# Patient Record
Sex: Female | Born: 1980 | Race: White | Hispanic: No | Marital: Single | Smoking: Current some day smoker
Health system: Southern US, Community
[De-identification: ages and names within clinical notes are randomized; demographics above are authoritative.]

## PROBLEM LIST (undated history)

## (undated) DIAGNOSIS — Z969 Presence of functional implant, unspecified: Secondary | ICD-10-CM

## (undated) DIAGNOSIS — Z98811 Dental restoration status: Secondary | ICD-10-CM

---

## 2005-05-10 ENCOUNTER — Emergency Department (HOSPITAL_COMMUNITY): Admission: EM | Admit: 2005-05-10 | Discharge: 2005-05-10 | Payer: Self-pay | Admitting: Family Medicine

## 2006-03-19 ENCOUNTER — Encounter (INDEPENDENT_AMBULATORY_CARE_PROVIDER_SITE_OTHER): Payer: Self-pay | Admitting: *Deleted

## 2006-03-19 ENCOUNTER — Ambulatory Visit (HOSPITAL_COMMUNITY): Admission: EM | Admit: 2006-03-19 | Discharge: 2006-03-20 | Payer: Self-pay | Admitting: Emergency Medicine

## 2006-03-19 HISTORY — PX: CHOLECYSTECTOMY: SHX55

## 2006-08-22 ENCOUNTER — Encounter (INDEPENDENT_AMBULATORY_CARE_PROVIDER_SITE_OTHER): Payer: Self-pay | Admitting: *Deleted

## 2006-08-22 ENCOUNTER — Ambulatory Visit: Payer: Self-pay | Admitting: Family Medicine

## 2006-08-22 ENCOUNTER — Other Ambulatory Visit: Admission: RE | Admit: 2006-08-22 | Discharge: 2006-08-22 | Payer: Self-pay | Admitting: Family Medicine

## 2006-08-22 ENCOUNTER — Encounter (INDEPENDENT_AMBULATORY_CARE_PROVIDER_SITE_OTHER): Payer: Self-pay | Admitting: Family Medicine

## 2006-08-22 LAB — CONVERTED CEMR LAB
Basophils Relative: 0.6 % (ref 0.0–1.0)
Cholesterol: 136 mg/dL (ref 0–200)
Eosinophils Relative: 0.9 % (ref 0.0–5.0)
HCT: 42.5 % (ref 36.0–46.0)
HCV Ab: NEGATIVE
Hemoglobin: 14.8 g/dL (ref 12.0–15.0)
Hep B S Ab: POSITIVE — AB
LDL Cholesterol: 74 mg/dL (ref 0–99)
MCHC: 34.7 g/dL (ref 30.0–36.0)
Monocytes Absolute: 0.7 10*3/uL (ref 0.2–0.7)
Neutrophils Relative %: 71.5 % (ref 43.0–77.0)
RBC: 4.83 M/uL (ref 3.87–5.11)
RDW: 11.9 % (ref 11.5–14.6)
VLDL: 21 mg/dL (ref 0–40)
WBC: 11.8 10*3/uL — ABNORMAL HIGH (ref 4.5–10.5)

## 2006-09-03 ENCOUNTER — Ambulatory Visit: Payer: Self-pay | Admitting: Family Medicine

## 2006-09-03 LAB — CONVERTED CEMR LAB
Basophils Absolute: 0 10*3/uL (ref 0.0–0.1)
Eosinophils Absolute: 0.2 10*3/uL (ref 0.0–0.6)
Eosinophils Relative: 2.3 % (ref 0.0–5.0)
HCT: 38.6 % (ref 36.0–46.0)
Hepatitis B Surface Ag: NEGATIVE
Neutrophils Relative %: 77.3 % — ABNORMAL HIGH (ref 43.0–77.0)
Platelets: 274 10*3/uL (ref 150–400)
RBC: 4.35 M/uL (ref 3.87–5.11)
RDW: 12 % (ref 11.5–14.6)
WBC: 10.2 10*3/uL (ref 4.5–10.5)

## 2006-09-05 ENCOUNTER — Encounter: Admission: RE | Admit: 2006-09-05 | Discharge: 2006-09-05 | Payer: Self-pay | Admitting: Family Medicine

## 2006-10-08 ENCOUNTER — Encounter (INDEPENDENT_AMBULATORY_CARE_PROVIDER_SITE_OTHER): Payer: Self-pay | Admitting: Family Medicine

## 2006-11-01 ENCOUNTER — Encounter (INDEPENDENT_AMBULATORY_CARE_PROVIDER_SITE_OTHER): Payer: Self-pay | Admitting: Family Medicine

## 2007-02-07 ENCOUNTER — Ambulatory Visit: Payer: Self-pay | Admitting: Family Medicine

## 2007-02-07 DIAGNOSIS — J069 Acute upper respiratory infection, unspecified: Secondary | ICD-10-CM | POA: Insufficient documentation

## 2007-02-07 DIAGNOSIS — J029 Acute pharyngitis, unspecified: Secondary | ICD-10-CM

## 2007-02-11 ENCOUNTER — Telehealth (INDEPENDENT_AMBULATORY_CARE_PROVIDER_SITE_OTHER): Payer: Self-pay | Admitting: *Deleted

## 2007-11-21 ENCOUNTER — Inpatient Hospital Stay (HOSPITAL_COMMUNITY): Admission: AD | Admit: 2007-11-21 | Discharge: 2007-11-21 | Payer: Self-pay | Admitting: *Deleted

## 2007-11-25 ENCOUNTER — Inpatient Hospital Stay (HOSPITAL_COMMUNITY): Admission: AD | Admit: 2007-11-25 | Discharge: 2007-11-28 | Payer: Self-pay | Admitting: *Deleted

## 2007-11-29 ENCOUNTER — Ambulatory Visit (HOSPITAL_COMMUNITY): Admission: RE | Admit: 2007-11-29 | Discharge: 2007-11-29 | Payer: Self-pay | Admitting: Obstetrics

## 2007-11-29 ENCOUNTER — Inpatient Hospital Stay (HOSPITAL_COMMUNITY): Admission: AD | Admit: 2007-11-29 | Discharge: 2007-11-29 | Payer: Self-pay | Admitting: Obstetrics & Gynecology

## 2007-12-01 ENCOUNTER — Inpatient Hospital Stay (HOSPITAL_COMMUNITY): Admission: AD | Admit: 2007-12-01 | Discharge: 2007-12-01 | Payer: Self-pay | Admitting: Obstetrics & Gynecology

## 2008-01-17 ENCOUNTER — Telehealth: Payer: Self-pay | Admitting: Family Medicine

## 2008-01-18 ENCOUNTER — Emergency Department (HOSPITAL_COMMUNITY): Admission: EM | Admit: 2008-01-18 | Discharge: 2008-01-18 | Payer: Self-pay | Admitting: Emergency Medicine

## 2009-01-13 HISTORY — PX: INDUCED ABORTION: SHX677

## 2010-09-30 NOTE — H&P (Signed)
NAMEJANIEL, DERHAMMER           ACCOUNT NO.:  [id number]   MEDICAL RECORD NO.:  [id number]          PATIENT TYPE:  EMS   LOCATION:  ED                           FACILITY:  [facility]   PHYSICIAN:  Clovis Pu. Cornett, M.D.DATE OF BIRTH:  Dec 20, 1980   DATE OF ADMISSION:  03/19/2006  DATE OF DISCHARGE:                                HISTORY & PHYSICAL   CHIEF COMPLAINT:  Right upper quadrant pain.   HISTORY OF PRESENT ILLNESS:  The patient is a 30 year old female with a one  day history of right upper quadrant pain with nausea and vomiting.  The pain  is severe in the right upper quadrant with radiation to her back.  Started  last night about 3 o'clock in the morning, has been constant ever since  getting worse as the day has gone on.  She had ultrasound done back in  February which showed cholelithiasis.  She was seen by her primary care  doctor today at Urgent Care and then sent here for me to see her.  She is  still complaining of severe right upper quadrant pain with nausea and  vomiting.  The pain is not getting any better.   PAST MEDICAL HISTORY:  None.   PAST SURGICAL HISTORY:  None.   FAMILY HISTORY:  Positive for gallstones in her mother.   SOCIAL HISTORY:  Positive for cigarette use, a pack a day, denies alcohol  use.   ALLERGIES:  None.   MEDICATIONS:  None.   REVIEW OF SYSTEMS:  As stated above, otherwise a 15 point review of systems  is negative.   PHYSICAL EXAM:  Temperature 98.4, pulse 97, blood pressure 140/97  GENERAL APPEARANCE:  Pleasant female in no apparent distress.  HEENT:  No scleral icterus, oropharynx is moist.  NECK:  Supple non tender full range of motion, no mass, trachea midline.  CHEST:  Clear to auscultation, chest wall motion normal.  CARDIOVASCULAR:  Regular rate and rhythm without rub, murmur or gallop.  EXTREMITIES:  Were warm with good perfusion.  ABDOMEN:  Tender right upper quadrant was some mild rebound, guarding and  positive Murphy's.   No mass or hernia.  EXTREMITIES:  Muscle tone normal,  range of motion normal.  NEUROLOGICAL:  Motor, sensory function grossly intact.   IMPRESSION:  Acute cholecystitis.   PLAN:  After reviewing her past ultrasound and given her symptomatology, she  at least has colic type symptoms and does have tenderness at this point  which would be consistent with acute cholecystitis.  I have offered her  laparoscopic cholecystectomy with cholangiogram.  I will try to do this  tonight if possible to get this as early as we can to ensure the best  possible outcome from the standpoint follow-up doing it laparoscopically.  I  explained the  procedure to her to include the complications of bleeding, infection,  potential common bile duct injury, injury to stomach, liver and colon.  She  voices her understanding and agrees to proceed.  Will go ahead and try to  get this done.      Thomas A. Cornett, M.D.  Electronically Signed  TAC/MEDQ  D:  03/19/2006  T:  03/20/2006  Job:  [id number]

## 2010-09-30 NOTE — Op Note (Signed)
Joanne Soto, ADCOX           ACCOUNT NO.:  [id number]   MEDICAL RECORD NO.:  [id number]          PATIENT TYPE:  INP   LOCATION:  0098                         FACILITY:  [facility]   PHYSICIAN:  Clovis Pu. Cornett, M.D.DATE OF BIRTH:  06-30-1980   DATE OF PROCEDURE:  03/19/2006  DATE OF DISCHARGE:                                 OPERATIVE REPORT   PREOPERATIVE DIAGNOSIS:  Acute cholecystitis.   POSTOPERATIVE DIAGNOSIS:  Acute cholecystitis.   PROCEDURE:  Laparoscopic cholecystectomy with intraoperative cholangiogram.   SURGEON:  Harriette Bouillon, MD   ANESTHESIA:  General endotracheal anesthesia with 20 mL of 0.25% Sensorcaine  local.   DRAINS:  None.   ESTIMATED BLOOD LOSS:  30 mL.   SPECIMEN:  Gallbladder with gallstones to pathology.   INDICATIONS FOR PROCEDURE:  The patient is 30 year old female who came in  tonight with acute cholecystitis.  She is brought to the operating room for  laparoscopic cholecystectomy.   DESCRIPTION OF PROCEDURE:  The patient brought to the operating suite,  placed supine.  Informed consent was obtained prior to coming to the  operating room.  After induction of general anesthesia the abdomen was  prepped and draped in sterile fashion.  A 1 cm supraumbilical incision was  made.  Dissection was carried down to her fascia.  Her fascia was opened in  the midline.  The abdominal cavity was entered with my finger bluntly.  A  pursestring suture of 0 Vicryl was placed in the fascial incision and a 12-  mm Hassan cannula was placed under direct vision.  Pneumoperitoneum was  created to 15 mmHg of CO2 and a laparoscope was placed.  Laparoscopy was  performed.  There was significant dilatation of the entire colon and small  bowel consistent with constipation.  The patient was placed in reverse  Trendelenburg and rolled to her left.  The 10-mm subxiphoid port was placed  under direct vision.  Two 5 mm ports placed the right midabdomen both under  direct  vision.  The gallbladder identified, grasped by its dome and  retracted toward the patient's right shoulder.  There was some early acute  inflammatory changes of edema noted to the gallbladder.  A second grasper  was used to grab the infundibulum.  This was pulled to her right lower  quadrant.  Cautery was used to score the peritoneum overlying the  infundibulum and push this away bluntly.  I was able to dissect out the  cystic duct circumferentially.  There were some small vessels overlying the  cystic duct going to the gallbladder which I controlled with a clip.  Once I  dissect out the cystic duct circumferentially this was the only tubular  structure entering the gallbladder.  I mobilized the gallbladder on both  medial and lateral peritoneal attachments to better mobilize the  infundibulum.  A clip was placed in the gallbladder side of the cystic duct.  Small incision was made.  Initially a Cook cholangiogram catheter was  introduced.  I could not get this to stay in the cystic duct due to a valve.  This was removed.  I used a  second Cook cholangiogram catheter and again  could not get it to feed down the cystic duct.  A Reddick catheter was used.  An Angiocath was then placed and the catheter was fed through it.  I was  able to get the catheter down the cystic duct a little bit and control it  with a clip.  It flush very hard and there was some extravasation but I was  able to dilate the cystic duct and flushed contrast through it to shoot an  intraoperative cholangiogram with fluoroscopy.  I was able to visualize the  cystic duct entering the common duct with free flow down the common duct  into the duodenum without any obvious signs of obstruction.  This free flow  of contrast up the common hepatic duct into the left and right hepatic  ducts.  This was very faint and very difficult to do due to that fact that I  could not pass the cholangiogram catheter far enough down to get a  clip  without compromising the lumen of it.  There felt to be a valve that I was  trying to get around and just had a very difficult time.  I saw enough of  the common duct bifurcation into a free flow of the duodenum but I did not  see any significant abnormality.  The catheter was removed, the cystic duct  was clipped with four clips and divided.  Cystic artery was dissected out.  It was controlled with clips and divided.  There is a posterior branch of  cystic artery.  This was controlled with clips and divided.  There smaller  vessels that I controlled with clips and divided the gallbladder and used  cautery to divide it.  The gallbladder was then dissected out of the  gallbladder fossa.  It was placed in the EndoCatch bag.  The gallbladder bed  was irrigated, suctioned and found to be hemostatic without signs of bile  leakage or bleeding.  The gallbladder was extracted through the umbilicus  and passed off the field.  The excess irrigation was suctioned out and the  abdominal cavity inspected for signs of bowel injury which I saw none.  At  this point, I removed all my ports, released the CO2 and closed the  umbilical fascia under direct vision with a pursestring suture of 0 Vicryl.  I then placed a second stitch to help secure this from the outside in the  fascia.  At this point, the CO2 was allowed to escape and all instruments  were withdrawn.  Skin was closed with 4-0 Monocryl.  Steri-Strips and dry  dressings were applied.  All final counts of sponge, needle and instruments  were found to be correct for this portion of the case.  The patient was  awoke, taken to recovery in satisfactory condition.      Thomas A. Cornett, M.D.  Electronically Signed     TAC/MEDQ  D:  03/19/2006  T:  03/20/2006  Job:  [id number]

## 2011-02-09 LAB — COMPREHENSIVE METABOLIC PANEL WITH GFR
ALT: 13
ALT: 16
AST: 23
AST: 24
Albumin: 2.2 — ABNORMAL LOW
Albumin: 2.5 — ABNORMAL LOW
Alkaline Phosphatase: 114
Alkaline Phosphatase: 129 — ABNORMAL HIGH
BUN: 6
BUN: 9
CO2: 20
CO2: 21
Calcium: 8.6
Calcium: 8.8
Chloride: 105
Chloride: 106
Creatinine, Ser: 0.57
Creatinine, Ser: 0.86
GFR calc non Af Amer: >60
GFR calc non Af Amer: >60
Glucose, Bld: 113 — ABNORMAL HIGH
Glucose, Bld: 128 — ABNORMAL HIGH
Potassium: 3.5
Potassium: 3.7
Sodium: 134 — ABNORMAL LOW
Sodium: 136
Total Bilirubin: 0.4
Total Bilirubin: 0.7
Total Protein: 5 — ABNORMAL LOW
Total Protein: 5.8 — ABNORMAL LOW

## 2011-02-09 LAB — DIFFERENTIAL
Band Neutrophils: 3
Basophils Relative: 0
Blasts: 0
Eosinophils Absolute: 0
Eosinophils Relative: 0
Lymphocytes Relative: 13
Lymphs Abs: 2.6
Monocytes Absolute: 0.8
Monocytes Relative: 4

## 2011-02-09 LAB — CBC
HCT: 31.1 — ABNORMAL LOW
HCT: 33.8 — ABNORMAL LOW
Hemoglobin: 11.8 — ABNORMAL LOW
MCHC: 34.7
MCHC: 35
MCV: 89.8
MCV: 90.7
Platelets: 225
Platelets: 243
RBC: 3.76 — ABNORMAL LOW
RDW: 13.5
RDW: 13.7
WBC: 12.4 — ABNORMAL HIGH

## 2011-02-09 LAB — LACTATE DEHYDROGENASE
LDH: 194
LDH: 203

## 2011-02-09 LAB — SYPHILIS: RPR W/REFLEX TO RPR TITER AND TREPONEMAL ANTIBODIES, TRADITIONAL SCREENING AND DIAGNOSIS ALGORITHM: RPR Ser Ql: NONREACTIVE

## 2011-02-09 LAB — URIC ACID: Uric Acid, Serum: 5.9

## 2011-02-10 LAB — COMPREHENSIVE METABOLIC PANEL
ALT: 24
Albumin: 2.2 — ABNORMAL LOW
Albumin: 2.6 — ABNORMAL LOW
Alkaline Phosphatase: 91
Alkaline Phosphatase: 96
BUN: 6
BUN: 9
CO2: 24
Calcium: 8.5
Chloride: 107
Creatinine, Ser: 0.65
GFR calc non Af Amer: >60
Glucose, Bld: 127 — ABNORMAL HIGH
Glucose, Bld: 79
Glucose, Bld: 95
Potassium: 4.1
Potassium: 4.2
Sodium: 139
Sodium: 141
Total Bilirubin: 0.2 — ABNORMAL LOW
Total Bilirubin: 0.4
Total Protein: 5.1 — ABNORMAL LOW
Total Protein: 5.4 — ABNORMAL LOW

## 2011-02-10 LAB — CBC
HCT: 30.9 — ABNORMAL LOW
Hemoglobin: 10.6 — ABNORMAL LOW
MCHC: 34.1
MCHC: 34.3
MCV: 91.2
RBC: 3.09 — ABNORMAL LOW
RBC: 3.35 — ABNORMAL LOW
RBC: 3.4 — ABNORMAL LOW
RDW: 13.8
RDW: 14.1
WBC: 11.1 — ABNORMAL HIGH

## 2011-02-10 LAB — LACTATE DEHYDROGENASE
LDH: 202
LDH: 241

## 2011-02-10 LAB — URIC ACID: Uric Acid, Serum: 5.7

## 2012-10-08 ENCOUNTER — Encounter: Payer: Self-pay | Admitting: *Deleted

## 2012-10-09 ENCOUNTER — Ambulatory Visit (INDEPENDENT_AMBULATORY_CARE_PROVIDER_SITE_OTHER): Payer: Commercial Managed Care - PPO | Admitting: Family Medicine

## 2012-10-09 ENCOUNTER — Encounter: Payer: Self-pay | Admitting: Family Medicine

## 2012-10-09 VITALS — BP 116/75 | HR 72 | Resp 16 | Ht 62.5 in | Wt 239.0 lb

## 2012-10-09 DIAGNOSIS — R41840 Attention and concentration deficit: Secondary | ICD-10-CM | POA: Insufficient documentation

## 2012-10-09 DIAGNOSIS — Z309 Encounter for contraceptive management, unspecified: Secondary | ICD-10-CM

## 2012-10-09 MED ORDER — METHYLPHENIDATE HCL ER (OSM) 54 MG PO TBCR: 54.0000 mg | EXTENDED_RELEASE_TABLET | ORAL | Status: DC

## 2012-10-09 MED ORDER — NORETHINDRONE ACET-ETHINYL EST 1-20 MG-MCG PO TABS: 1.0000 | ORAL_TABLET | Freq: Every day | ORAL | Status: DC

## 2012-10-09 NOTE — Assessment & Plan Note (Signed)
Refilled her Concerta 

## 2012-10-09 NOTE — Patient Instructions (Addendum)
Diet Following Bariatric Surgery The bariatric diet is designed to provide fluids and nourishment while promoting weight loss after bariatric surgery. The diet is divided into 3 stages. The rate of progression varies based on individual food tolerance. DIET FOLLOWING BARIATRIC SURGERY The diet following surgery is divided into 3 stages to allow a gradual adjustment. It is very important to the success of your surgery to:  Progress to each stage slowly.  Eat at set times.  Chew food well and stop eating when you are full.  Not drink liquids 30 minutes before and after meals. If you feel tightness or pressure in your chest, that means you are full. Wait 30 minutes before you try to eat again. STAGE 1 BARIATRIC DIET - ABOUT 2 WEEKS IN DURATION   The diet begins the day of surgery. It will last about 1 to 2 weeks after surgery. Your surgeon may have individual guidelines for you about specific foods or the progression of your diet. Follow your surgeon's guidelines.  If clear liquids are well-tolerated without vomiting, your caregiver will add a 4 oz to 6 oz high protein, low-calorie liquid supplement. You could add this to your meal plan 3 times daily. You will need at least 60 g to 80 g of protein daily or as determined by your Registered Dietitian.  Guidelines for choosing a protein supplement include:  At least 15 g of protein per 8 oz serving.  Less than 20 g total carbohydrate per 8 oz serving.  Less than 5 g fat per 8 oz serving.  Avoid carbonated beverages, caffeine, alcohol, and concentrated sweets such as sugar, cakes, and cookies.  Right after surgery, you may only be able to eat 3 to 4 tsp per meal. Your maximum volume should not exceed  to  cup total. Do not eat or drink more than 1 oz or 2 tbs every 15 minutes.  Take a chewable multivitamin and mineral supplement.  Drink at least 48 oz of fluid daily, which includes your protein supplement. Food and beverages from the  list below are allowed at set times (for example at 8 AM, 12 noon, or 5 PM):  Decaffeinated coffee or tea.  100% fruit juice.  Diet or sugar-free drinks.  Broth.  Blenderized soup.  Skim milk or lactose-free milk.  Sugar-free gelatin dessert or frozen ice pops.  Mashed potatoes.  Yogurt (artificially sweetened).  Sugar-free pudding.  Blended low-fat cottage cheese.  Unsweetened applesauce, grits, or hot wheat cereal. Four to six ounces of a liquid protein supplement from the list below is recommended for snacks at 10 AM, 2 PM, and 8 PM.  STAGE 2 BARIATRIC DIET (SOFT DIET) - ABOUT 4 WEEKS IN DURATION  About 2 weeks after surgery, your caregiver will progress your diet to this stage. Foods may need to be blended to the consistency of applesauce. Choose low-fat foods (less than 5 g of fat per serving) and avoid concentrated sweets and sugar (less than 10 g of sugar per serving). Meals should not exceed  to  cup total. This stage will last about 4 weeks. It is recommended that you meet with your dietitian at this stage to begin preparation for the last stage. This stage consists of 3 meals a day with a liquid protein supplement between meals twice daily. Do not drink liquids with foods. You must wait 30 minutes for the stomach pouch to empty before drinking. Chew food well. The food must be almost liquified before swallowing. Soft foods from the   list below can now be slowly added to your diet:  Soft fruit (soft canned fruit in light syrup or natural juice, banana, melon, peaches, pears, or strawberries).  Cooked vegetables.  Toast or crackers (becomes soft after chewing 20 times).  Hot wheat cereal.  Fish.  Eggs (scrambled, soft-boiled). STAGE 3 BARIATRIC DIET (REGULAR DIET) - ABOUT 6 to 8 WEEKS AFTER SURGERY About 6 to 8 weeks after surgery, you will be advanced to food that is regular in texture. This diet should include all food groups. The diet will continue to promote  weight loss. Meals should not exceed  to 1 cup total. Your dietitian will be available to assist you in meal planning and additional behavioral strategies to make this final stage a long-term success. Slowly add foods of regular consistency and remember:  Eat only at your chosen meal times.  Minimize drinking with meals. You should drink 30 minutes before eating. Do not start drinking again for about 2 hours after eating.  Chew food well. Take small bites.  Think about the portion size of a healthy frozen meal. You will be able to eat most of this.  Make sure your meal is balanced with starch, protein, fruits, and vegetables.  When you feel full, stop eating. Document Released: 11/05/2002 Document Revised: 07/24/2011 Document Reviewed: 07/29/2010 [facility] Patient Information 2014 [place], [place]. Diet Following Bariatric Surgery The bariatric diet is designed to provide fluids and nourishment while promoting weight loss after bariatric surgery. The diet is divided into 3 stages. The rate of progression varies based on individual food tolerance. DIET FOLLOWING BARIATRIC SURGERY The diet following surgery is divided into 3 stages to allow a gradual adjustment. It is very important to the success of your surgery to:  Progress to each stage slowly.  Eat at set times.  Chew food well and stop eating when you are full.  Not drink liquids 30 minutes before and after meals. If you feel tightness or pressure in your chest, that means you are full. Wait 30 minutes before you try to eat again. STAGE 1 BARIATRIC DIET - ABOUT 2 WEEKS IN DURATION   The diet begins the day of surgery. It will last about 1 to 2 weeks after surgery. Your surgeon may have individual guidelines for you about specific foods or the progression of your diet. Follow your surgeon's guidelines.  If clear liquids are well-tolerated without vomiting, your caregiver will add a 4 oz to 6 oz high protein, low-calorie liquid  supplement. You could add this to your meal plan 3 times daily. You will need at least 60 g to 80 g of protein daily or as determined by your Registered Dietitian.  Guidelines for choosing a protein supplement include:  At least 15 g of protein per 8 oz serving.  Less than 20 g total carbohydrate per 8 oz serving.  Less than 5 g fat per 8 oz serving.  Avoid carbonated beverages, caffeine, alcohol, and concentrated sweets such as sugar, cakes, and cookies.  Right after surgery, you may only be able to eat 3 to 4 tsp per meal. Your maximum volume should not exceed  to  cup total. Do not eat or drink more than 1 oz or 2 tbs every 15 minutes.  Take a chewable multivitamin and mineral supplement.  Drink at least 48 oz of fluid daily, which includes your protein supplement. Food and beverages from the list below are allowed at set times (for example at 8 AM, 12 noon, or 5  PM):  Decaffeinated coffee or tea.  100% fruit juice.  Diet or sugar-free drinks.  Broth.  Blenderized soup.  Skim milk or lactose-free milk.  Sugar-free gelatin dessert or frozen ice pops.  Mashed potatoes.  Yogurt (artificially sweetened).  Sugar-free pudding.  Blended low-fat cottage cheese.  Unsweetened applesauce, grits, or hot wheat cereal. Four to six ounces of a liquid protein supplement from the list below is recommended for snacks at 10 AM, 2 PM, and 8 PM.  STAGE 2 BARIATRIC DIET (SOFT DIET) - ABOUT 4 WEEKS IN DURATION  About 2 weeks after surgery, your caregiver will progress your diet to this stage. Foods may need to be blended to the consistency of applesauce. Choose low-fat foods (less than 5 g of fat per serving) and avoid concentrated sweets and sugar (less than 10 g of sugar per serving). Meals should not exceed  to  cup total. This stage will last about 4 weeks. It is recommended that you meet with your dietitian at this stage to begin preparation for the last stage. This stage consists  of 3 meals a day with a liquid protein supplement between meals twice daily. Do not drink liquids with foods. You must wait 30 minutes for the stomach pouch to empty before drinking. Chew food well. The food must be almost liquified before swallowing. Soft foods from the list below can now be slowly added to your diet:  Soft fruit (soft canned fruit in light syrup or natural juice, banana, melon, peaches, pears, or strawberries).  Cooked vegetables.  Toast or crackers (becomes soft after chewing 20 times).  Hot wheat cereal.  Fish.  Eggs (scrambled, soft-boiled). STAGE 3 BARIATRIC DIET (REGULAR DIET) - ABOUT 6 to 8 WEEKS AFTER SURGERY About 6 to 8 weeks after surgery, you will be advanced to food that is regular in texture. This diet should include all food groups. The diet will continue to promote weight loss. Meals should not exceed  to 1 cup total. Your dietitian will be available to assist you in meal planning and additional behavioral strategies to make this final stage a long-term success. Slowly add foods of regular consistency and remember:  Eat only at your chosen meal times.  Minimize drinking with meals. You should drink 30 minutes before eating. Do not start drinking again for about 2 hours after eating.  Chew food well. Take small bites.  Think about the portion size of a healthy frozen meal. You will be able to eat most of this.  Make sure your meal is balanced with starch, protein, fruits, and vegetables.  When you feel full, stop eating. Document Released: 11/05/2002 Document Revised: 07/24/2011 Document Reviewed: 07/29/2010 [facility] Patient Information 2014 [place], [place].

## 2012-10-09 NOTE — Assessment & Plan Note (Signed)
Refilled her Loestrin.

## 2012-10-09 NOTE — Progress Notes (Signed)
  Subjective:    Patient ID: Joanne Soto, female    DOB: 1980/09/22, 32 y.o.   MRN: [id number]  HPI  Joanne Soto is here today to discuss a few issues:    1)  ADD:  She needs to get a refill on her Concerta.    2)  Oral Contraceptives:  She has done well on her birth control and needs a refill on it.   3)  Weight:  She has been working hard on her diet and exercise.  She is working out with a Psychologist, educational.     Review of Systems  Constitutional: Negative for activity change, fatigue and unexpected weight change (She is working hard on her diet and exercise.  ).       She has been dieting and working out with a trainer for the past 2 months.    HENT: Negative.   Eyes: Negative.   Respiratory: Negative for shortness of breath.   Cardiovascular: Negative for chest pain, palpitations and leg swelling.  Gastrointestinal: Negative for diarrhea and constipation.  Endocrine: Negative.   Genitourinary: Negative for difficulty urinating.  Musculoskeletal: Negative.   Skin: Negative.   Neurological: Negative.   Hematological: Negative for adenopathy. Does not bruise/bleed easily.  Psychiatric/Behavioral: Positive for decreased concentration. Negative for sleep disturbance and dysphoric mood. The patient is not nervous/anxious.     Past Medical History  Diagnosis Date  . High risk HPV infection   . HSV-2 (herpes simplex virus 2) infection   . Ovarian cyst    Family History  Problem Relation Age of Onset  . Hypertension Father   . Heart disease Paternal Grandfather   . CVA Paternal Grandfather   . Diabetes Paternal Grandfather   . Hypertension Paternal Grandfather    History   Social History Narrative   Marital Status: Single Recruitment consultant)    Children:  Daughter Joanne Soto)    Pets:  None    Living Situation: Lives with her daughter and grandmother Joanne Soto).   Occupation: CNA Editor, commissioning ED)     Education:  Nursing Student ([employer])       Tobacco Use/Exposure:  She has smoked  up to 1/2 ppd.  She has smoked off and on since the age of 79. She quit during her pregnancy and did not smoke for 4 years.  She started back in the spring of 2012.  She is currently smoking 1 pack per week.  She would like to quit.     Alcohol Use:  Occasional   Drug Use:  None   Diet:  Regular    Exercise:  Active Job    Hobbies:  Reading                Objective:   Physical Exam  Constitutional: She appears well-nourished. No distress.  HENT:  Head: Normocephalic.  Eyes: No scleral icterus.  Neck: No thyromegaly present.  Cardiovascular: Normal rate, regular rhythm and normal heart sounds.   Pulmonary/Chest: Effort normal and breath sounds normal.  Abdominal: There is no tenderness.  Musculoskeletal: She exhibits no edema and no tenderness.  Neurological: She is alert.  Skin: Skin is warm and dry.  Psychiatric: She has a normal mood and affect. Her behavior is normal. Judgment and thought content normal.        Assessment & Plan:

## 2012-10-10 ENCOUNTER — Telehealth: Payer: Self-pay | Admitting: *Deleted

## 2012-10-10 NOTE — Telephone Encounter (Signed)
Opened in error

## 2012-10-11 ENCOUNTER — Telehealth: Payer: Self-pay | Admitting: Family Medicine

## 2012-10-15 ENCOUNTER — Telehealth: Payer: Self-pay

## 2012-10-15 NOTE — Telephone Encounter (Signed)
Called patient to find out preference on birth control medication. She would prefer no Fe.

## 2012-11-02 ENCOUNTER — Encounter: Payer: Self-pay | Admitting: Family Medicine

## 2012-11-02 NOTE — Assessment & Plan Note (Signed)
She will continue to work on diet and exercise. 

## 2012-11-04 NOTE — Telephone Encounter (Signed)
Done

## 2013-01-09 ENCOUNTER — Ambulatory Visit (INDEPENDENT_AMBULATORY_CARE_PROVIDER_SITE_OTHER): Payer: Commercial Managed Care - PPO | Admitting: *Deleted

## 2013-01-09 ENCOUNTER — Ambulatory Visit: Payer: Commercial Managed Care - PPO | Admitting: Family Medicine

## 2013-01-09 DIAGNOSIS — Z23 Encounter for immunization: Secondary | ICD-10-CM

## 2013-02-18 ENCOUNTER — Encounter: Payer: Self-pay | Admitting: Family Medicine

## 2013-02-18 ENCOUNTER — Other Ambulatory Visit: Payer: Self-pay | Admitting: Family Medicine

## 2013-02-18 ENCOUNTER — Encounter: Payer: Self-pay | Admitting: *Deleted

## 2013-02-18 ENCOUNTER — Ambulatory Visit (INDEPENDENT_AMBULATORY_CARE_PROVIDER_SITE_OTHER): Payer: Commercial Managed Care - PPO | Admitting: Family Medicine

## 2013-02-18 VITALS — BP 122/85 | HR 82 | Wt 268.0 lb

## 2013-02-18 DIAGNOSIS — Z2089 Contact with and (suspected) exposure to other communicable diseases: Secondary | ICD-10-CM

## 2013-02-18 DIAGNOSIS — A5901 Trichomonal vulvovaginitis: Secondary | ICD-10-CM

## 2013-02-18 DIAGNOSIS — Z202 Contact with and (suspected) exposure to infections with a predominantly sexual mode of transmission: Secondary | ICD-10-CM

## 2013-02-18 DIAGNOSIS — N760 Acute vaginitis: Secondary | ICD-10-CM

## 2013-02-18 MED ORDER — CLINDAMYCIN PHOSPHATE 2 % VA CREA: 1.0000 | TOPICAL_CREAM | Freq: Every day | VAGINAL | Status: DC

## 2013-02-18 MED ORDER — METRONIDAZOLE 500 MG PO TABS
ORAL_TABLET | ORAL | Status: DC
Start: 2013-02-18 — End: 2013-04-04

## 2013-02-18 NOTE — Progress Notes (Signed)
Subjective:    Patient ID: Joanne Soto, female    DOB: 1980/10/21, 32 y.o.   MRN: [id number]  Joanne Soto is here today to have a vaginal exam.  She has been struggling with a vaginal discharge and irritation for the past two weeks   Vaginal Discharge The patient's primary symptoms include genital itching, a genital odor and a vaginal discharge. The patient's pertinent negatives include no genital lesions or pelvic pain. This is a new problem. Episode onset: 2 weeks ago. The problem has been unchanged. The patient is experiencing no pain. She is not pregnant. Pertinent negatives include no dysuria, fever or painful intercourse. The vaginal discharge was malodorous, thin, copious, green and yellow. There has been no bleeding. Nothing aggravates the symptoms. She has tried antifungals for the symptoms. The treatment provided no relief. She is sexually active. No, her partner does not have an STD. She uses oral contraceptives for contraception. Her menstrual history has been regular. Her past medical history is significant for herpes simplex. STD: HPV (High Risk)     Review of Systems  Constitutional: Negative.  Negative for fever.  HENT: Negative.   Eyes: Negative.   Respiratory: Negative.   Cardiovascular: Negative.   Gastrointestinal: Negative.   Endocrine: Negative.   Genitourinary: Positive for vaginal discharge. Negative for dysuria and pelvic pain.  Musculoskeletal: Negative.   Allergic/Immunologic: Negative.   Neurological: Negative.   Hematological: Negative.   Psychiatric/Behavioral: Negative.      Past Medical History  Diagnosis Date  . High risk HPV infection   . HSV-2 (herpes simplex virus 2) infection   . Ovarian cyst      Family History  Problem Relation Age of Onset  . Hypertension Father   . Heart disease Paternal Grandfather   . CVA Paternal Grandfather   . Diabetes Paternal Grandfather   . Hypertension Paternal Grandfather      History   Social  History Narrative   Marital Status: Single Recruitment consultant)    Children:  Daughter Jarold Song)    Pets:  None    Living Situation: Lives with her daughter and grandmother Joanne Soto).   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  She has smoked off and on since the age of 31. She quit during her pregnancy and did not smoke for 4 years.  She started back in the spring of 2012.  She is currently smoking 1 pack per week.  She would like to quit.     Alcohol Use:  Occasional   Drug Use:  None   Diet:  Regular    Exercise:  Active Job    Hobbies:  Reading                 Objective:   Physical Exam  Vitals reviewed. Constitutional: She appears well-developed and well-nourished.  Cardiovascular: Normal rate and regular rhythm.   Pulmonary/Chest: Effort normal and breath sounds normal.  Abdominal: Soft. Bowel sounds are normal.  Genitourinary: Vaginal discharge found.  Skin: Skin is warm and dry.  Psychiatric: She has a normal mood and affect.      Assessment & Plan:   Neriah was seen today for vaginal discharge.  Diagnoses and associated orders for this visit:  Trichomoniasis of vagina - metroNIDAZOLE (FLAGYL) 500 MG tablet; Take 4 tabs po x 1 day - clindamycin (CLEOCIN) 2 % vaginal cream;  Place 1 Applicatorful vaginally at bedtime.  Exposure to sexually transmitted disease (STD) - HIV Antibody ( Reflex) - Hepatitis C Antibody - GC/chlamydia probe amp, genital

## 2013-02-18 NOTE — Patient Instructions (Signed)
1)  Vaginal Discharge- Take the 4 Flagyl then insert the clindamycin cream night for 7 days.   2)  STD Exposure - We've tested you for GC/CH.  I would recommend getting testing for HIV/Hep C.       Trichomoniasis Trichomoniasis is an infection, caused by the Trichomonas organism, that affects both women and men. In women, the outer female genitalia and the vagina are affected. In men, the penis is mainly affected, but the prostate and other reproductive organs can also be involved. Trichomoniasis is a sexually transmitted disease (STD) and is most often passed to another person through sexual contact. The majority of people who get trichomoniasis do so from a sexual encounter and are also at risk for other STDs. CAUSES   Sexual intercourse with an infected partner.  It can be present in swimming pools or hot tubs. SYMPTOMS   Abnormal gray-green frothy vaginal discharge in women.  Vaginal itching and irritation in women.  Itching and irritation of the area outside the vagina in women.  Penile discharge with or without pain in males.  Inflammation of the urethra (urethritis), causing painful urination.  Bleeding after sexual intercourse. RELATED COMPLICATIONS  Pelvic inflammatory disease.  Infection of the uterus (endometritis).  Infertility.  Tubal (ectopic) pregnancy.  It can be associated with other STDs, including gonorrhea and chlamydia, hepatitis B, and HIV. COMPLICATIONS DURING PREGNANCY  Early (premature) delivery.  Premature rupture of the membranes (PROM).  Low birth weight. DIAGNOSIS   Visualization of Trichomonas under the microscope from the vagina discharge.  Ph of the vagina greater than 4.5, tested with a test tape.  Trich Rapid Test.  Culture of the organism, but this is not usually needed.  It may be found on a Pap test.  Having a "strawberry cervix,"which means the cervix looks very red like a strawberry. TREATMENT   You may be given  medication to fight the infection. Inform your caregiver if you could be or are pregnant. Some medications used to treat the infection should not be taken during pregnancy.  Over-the-counter medications or creams to decrease itching or irritation may be recommended.  Your sexual partner will need to be treated if infected. HOME CARE INSTRUCTIONS   Take all medication prescribed by your caregiver.  Take over-the-counter medication for itching or irritation as directed by your caregiver.  Do not have sexual intercourse while you have the infection.  Do not douche or wear tampons.  Discuss your infection with your partner, as your partner may have acquired the infection from you. Or, your partner may have been the person who transmitted the infection to you.  Have your sex partner examined and treated if necessary.  Practice safe, informed, and protected sex.  See your caregiver for other STD testing. SEEK MEDICAL CARE IF:   You still have symptoms after you finish the medication.  You have an oral temperature above 102 F (38.9 C).  You develop belly (abdominal) pain.  You have pain when you urinate.  You have bleeding after sexual intercourse.  You develop a rash.  The medication makes you sick or makes you throw up (vomit). Document Released: 10/25/2000 Document Revised: 07/24/2011 Document Reviewed: 11/20/2008 [facility] Patient Information 2014 [place], [place].

## 2013-02-19 ENCOUNTER — Telehealth: Payer: Self-pay | Admitting: *Deleted

## 2013-02-19 LAB — GC/CHLAMYDIA PROBE AMP
CT Probe RNA: NEGATIVE
GC Probe RNA: NEGATIVE

## 2013-02-19 NOTE — Telephone Encounter (Signed)
Joanne Soto is aware that her labs were negative.-eh

## 2013-03-21 ENCOUNTER — Other Ambulatory Visit: Payer: Self-pay | Admitting: Family Medicine

## 2013-04-04 ENCOUNTER — Encounter: Payer: Self-pay | Admitting: Family Medicine

## 2013-04-04 ENCOUNTER — Ambulatory Visit (INDEPENDENT_AMBULATORY_CARE_PROVIDER_SITE_OTHER): Payer: Commercial Managed Care - PPO | Admitting: Family Medicine

## 2013-04-04 VITALS — BP 107/66 | HR 69 | Resp 16 | Wt 273.0 lb

## 2013-04-04 DIAGNOSIS — B9689 Other specified bacterial agents as the cause of diseases classified elsewhere: Secondary | ICD-10-CM

## 2013-04-04 DIAGNOSIS — N760 Acute vaginitis: Secondary | ICD-10-CM

## 2013-04-04 DIAGNOSIS — F329 Major depressive disorder, single episode, unspecified: Secondary | ICD-10-CM

## 2013-04-04 DIAGNOSIS — A499 Bacterial infection, unspecified: Secondary | ICD-10-CM

## 2013-04-04 DIAGNOSIS — M722 Plantar fascial fibromatosis: Secondary | ICD-10-CM

## 2013-04-04 DIAGNOSIS — F3289 Other specified depressive episodes: Secondary | ICD-10-CM

## 2013-04-04 DIAGNOSIS — R41840 Attention and concentration deficit: Secondary | ICD-10-CM

## 2013-04-04 MED ORDER — FLUOXETINE HCL 40 MG PO CAPS: 40.0000 mg | ORAL_CAPSULE | Freq: Every day | ORAL | Status: DC

## 2013-04-04 MED ORDER — DICLOFENAC 35 MG PO CAPS: 1.0000 | ORAL_CAPSULE | Freq: Two times a day (BID) | ORAL | Status: DC

## 2013-04-04 MED ORDER — CLINDAMYCIN PHOSPHATE 2 % VA CREA: 1.0000 | TOPICAL_CREAM | Freq: Every day | VAGINAL | Status: DC

## 2013-04-04 MED ORDER — METHYLPHENIDATE HCL ER (OSM) 54 MG PO TBCR: 54.0000 mg | EXTENDED_RELEASE_TABLET | ORAL | Status: DC

## 2013-04-04 MED ORDER — NAPROXEN-ESOMEPRAZOLE 500-20 MG PO TBEC: 1.0000 | DELAYED_RELEASE_TABLET | Freq: Two times a day (BID) | ORAL | Status: DC

## 2013-04-04 MED ORDER — METRONIDAZOLE 0.75 % VA GEL: 1.0000 | Freq: Two times a day (BID) | VAGINAL | Status: DC

## 2013-04-04 NOTE — Patient Instructions (Signed)
1)  BV -  Metrogel vs Cleocin; Boric Acid in Douche; RePhresh Products   2)  Plantar Fasciitis /Bone Spur - Anti-inflammatory + Orthotics (Podiatrist)    Plantar Fasciitis (Heel Spur Syndrome) with Rehab The plantar fascia is a fibrous, ligament-like, soft-tissue structure that spans the bottom of the foot. Plantar fasciitis is a condition that causes pain in the foot due to inflammation of the tissue. SYMPTOMS   Pain and tenderness on the underneath side of the foot.  Pain that worsens with standing or walking. CAUSES  Plantar fasciitis is caused by irritation and injury to the plantar fascia on the underneath side of the foot. Common mechanisms of injury include:  Direct trauma to bottom of the foot.  Damage to a small nerve that runs under the foot where the main fascia attaches to the heel bone.  Stress placed on the plantar fascia due to bone spurs. RISK INCREASES WITH:   Activities that place stress on the plantar fascia (running, jumping, pivoting, or cutting).  Poor strength and flexibility.  Improperly fitted shoes.  Tight calf muscles.  Flat feet.  Failure to warm-up properly before activity.  Obesity. PREVENTION  Warm up and stretch properly before activity.  Allow for adequate recovery between workouts.  Maintain physical fitness:  Strength, flexibility, and endurance.  Cardiovascular fitness.  Maintain a health body weight.  Avoid stress on the plantar fascia.  Wear properly fitted shoes, including arch supports for individuals who have flat feet. PROGNOSIS  If treated properly, then the symptoms of plantar fasciitis usually resolve without surgery. However, occasionally surgery is necessary. RELATED COMPLICATIONS   Recurrent symptoms that may result in a chronic condition.  Problems of the lower back that are caused by compensating for the injury, such as limping.  Pain or weakness of the foot during push-off following surgery.  Chronic  inflammation, scarring, and partial or complete fascia tear, occurring more often from repeated injections. TREATMENT  Treatment initially involves the use of ice and medication to help reduce pain and inflammation. The use of strengthening and stretching exercises may help reduce pain with activity, especially stretches of the Achilles tendon. These exercises may be performed at home or with a therapist. Your caregiver may recommend that you use heel cups of arch supports to help reduce stress on the plantar fascia. Occasionally, corticosteroid injections are given to reduce inflammation. If symptoms persist for greater than 6 months despite non-surgical (conservative), then surgery may be recommended.  MEDICATION   If pain medication is necessary, then nonsteroidal anti-inflammatory medications, such as aspirin and ibuprofen, or other minor pain relievers, such as acetaminophen, are often recommended.  Do not take pain medication within 7 days before surgery.  Prescription pain relievers may be given if deemed necessary by your caregiver. Use only as directed and only as much as you need.  Corticosteroid injections may be given by your caregiver. These injections should be reserved for the most serious cases, because they may only be given a certain number of times. HEAT AND COLD  Cold treatment (icing) relieves pain and reduces inflammation. Cold treatment should be applied for 10 to 15 minutes every 2 to 3 hours for inflammation and pain and immediately after any activity that aggravates your symptoms. Use ice packs or massage the area with a piece of ice (ice massage).  Heat treatment may be used prior to performing the stretching and strengthening activities prescribed by your caregiver, physical therapist, or athletic trainer. Use a heat pack or soak  the injury in warm water. SEEK IMMEDIATE MEDICAL CARE IF:  Treatment seems to offer no benefit, or the condition worsens.  Any medications  produce adverse side effects. EXERCISES RANGE OF MOTION (ROM) AND STRETCHING EXERCISES - Plantar Fasciitis (Heel Spur Syndrome) These exercises may help you when beginning to rehabilitate your injury. Your symptoms may resolve with or without further involvement from your physician, physical therapist or athletic trainer. While completing these exercises, remember:   Restoring tissue flexibility helps normal motion to return to the joints. This allows healthier, less painful movement and activity.  An effective stretch should be held for at least 30 seconds.  A stretch should never be painful. You should only feel a gentle lengthening or release in the stretched tissue. RANGE OF MOTION - Toe Extension, Flexion  Sit with your right / left leg crossed over your opposite knee.  Grasp your toes and gently pull them back toward the top of your foot. You should feel a stretch on the bottom of your toes and/or foot.  Hold this stretch for __________ seconds.  Now, gently pull your toes toward the bottom of your foot. You should feel a stretch on the top of your toes and or foot.  Hold this stretch for __________ seconds. Repeat __________ times. Complete this stretch __________ times per day.  RANGE OF MOTION - Ankle Dorsiflexion, Active Assisted  Remove shoes and sit on a chair that is preferably not on a carpeted surface.  Place right / left foot under knee. Extend your opposite leg for support.  Keeping your heel down, slide your right / left foot back toward the chair until you feel a stretch at your ankle or calf. If you do not feel a stretch, slide your bottom forward to the edge of the chair, while still keeping your heel down.  Hold this stretch for __________ seconds. Repeat __________ times. Complete this stretch __________ times per day.  STRETCH  Gastroc, Standing  Place hands on wall.  Extend right / left leg, keeping the front knee somewhat bent.  Slightly point your toes  inward on your back foot.  Keeping your right / left heel on the floor and your knee straight, shift your weight toward the wall, not allowing your back to arch.  You should feel a gentle stretch in the right / left calf. Hold this position for __________ seconds. Repeat __________ times. Complete this stretch __________ times per day. STRETCH  Soleus, Standing  Place hands on wall.  Extend right / left leg, keeping the other knee somewhat bent.  Slightly point your toes inward on your back foot.  Keep your right / left heel on the floor, bend your back knee, and slightly shift your weight over the back leg so that you feel a gentle stretch deep in your back calf.  Hold this position for __________ seconds. Repeat __________ times. Complete this stretch __________ times per day. STRETCH  Gastrocsoleus, Standing  Note: This exercise can place a lot of stress on your foot and ankle. Please complete this exercise only if specifically instructed by your caregiver.   Place the ball of your right / left foot on a step, keeping your other foot firmly on the same step.  Hold on to the wall or a rail for balance.  Slowly lift your other foot, allowing your body weight to press your heel down over the edge of the step.  You should feel a stretch in your right / left calf.  Hold this position for __________ seconds.  Repeat this exercise with a slight bend in your right / left knee. Repeat __________ times. Complete this stretch __________ times per day.  STRENGTHENING EXERCISES - Plantar Fasciitis (Heel Spur Syndrome)  These exercises may help you when beginning to rehabilitate your injury. They may resolve your symptoms with or without further involvement from your physician, physical therapist or athletic trainer. While completing these exercises, remember:   Muscles can gain both the endurance and the strength needed for everyday activities through controlled exercises.  Complete these  exercises as instructed by your physician, physical therapist or athletic trainer. Progress the resistance and repetitions only as guided. STRENGTH - Towel Curls  Sit in a chair positioned on a non-carpeted surface.  Place your foot on a towel, keeping your heel on the floor.  Pull the towel toward your heel by only curling your toes. Keep your heel on the floor.  If instructed by your physician, physical therapist or athletic trainer, add ____________________ at the end of the towel. Repeat __________ times. Complete this exercise __________ times per day. STRENGTH - Ankle Inversion  Secure one end of a rubber exercise band/tubing to a fixed object (table, pole). Loop the other end around your foot just before your toes.  Place your fists between your knees. This will focus your strengthening at your ankle.  Slowly, pull your big toe up and in, making sure the band/tubing is positioned to resist the entire motion.  Hold this position for __________ seconds.  Have your muscles resist the band/tubing as it slowly pulls your foot back to the starting position. Repeat __________ times. Complete this exercises __________ times per day.  Document Released: 05/01/2005 Document Revised: 07/24/2011 Document Reviewed: 08/13/2008 [facility] Patient Information 2014 [place], [place].

## 2013-04-04 NOTE — Progress Notes (Signed)
Subjective:    Patient ID: Joanne Soto, female    DOB: 09/23/1980, 32 y.o.   MRN: [id number]  HPI  [name] is here today for medication refills and to discuss the conditions listed below:   1)  Foot Pain:  She has struggled with pain in her feet for several years .  She has tried many OTC devices which have not helped her very much.  She feels that this pain is worse when she is standing.   2)  Mood:  She feels that her mood is stable with her Prozac (40 mg).    3)  ADD:  She needs a refill on her ADD medication (Concerta 54 mg).  She says that the medication continues to help her concentrate at school/work.  She feels that this dosage is appropriate and would like to continue on it.    4)  Trichomonas:  She has completed her treatment with Flagyl and clindamycin cream.  She would like a GYN exam to confirm that this condition has cleared.    Review of Systems  Constitutional: Negative.   HENT: Negative.   Eyes: Negative.   Respiratory: Negative.   Cardiovascular: Negative.   Gastrointestinal: Negative.   Endocrine: Negative.   Genitourinary: Negative.   Musculoskeletal:       Bilateral feet pain.    Skin: Negative.   Allergic/Immunologic: Negative.   Neurological: Negative.   Hematological: Negative.   Psychiatric/Behavioral: Negative.      Past Medical History  Diagnosis Date  . High risk HPV infection   . HSV-2 (herpes simplex virus 2) infection   . Ovarian cyst      Family History  Problem Relation Age of Onset  . Hypertension Father   . Heart disease Paternal Grandfather   . CVA Paternal Grandfather   . Diabetes Paternal Grandfather   . Hypertension Paternal Grandfather      History   Social History Narrative   Marital Status: Single Recruitment consultant)    Children:  Daughter Jarold Song)    Pets:  None    Living Situation: Lives with her daughter and grandmother Thersa Salt).   Occupation: CNA Editor, commissioning ED)     Education:  Nursing Student  ([employer])       Tobacco Use/Exposure:  She has smoked up to 1/2 ppd.  She has smoked off and on since the age of 72. She quit during her pregnancy and did not smoke for 4 years.  She started back in the spring of 2012.  She is currently smoking 1 pack per week.  She would like to quit.     Alcohol Use:  Occasional   Drug Use:  None   Diet:  Regular    Exercise:  Active Job    Hobbies:  Reading                 Objective:   Physical Exam  Nursing note and vitals reviewed. Constitutional: She is oriented to person, place, and time. She appears well-developed and well-nourished.  Eyes: No scleral icterus.  Neck: No thyromegaly present.  Cardiovascular: Normal rate and regular rhythm.   Pulmonary/Chest: Effort normal and breath sounds normal.  Abdominal: Soft. She exhibits no distension and no mass. There is no tenderness.  Genitourinary: Vaginal discharge found.  Musculoskeletal: She exhibits tenderness (Bottom of Feet ).  Neurological: She is alert and oriented to person, place, and time.  Skin: Skin is warm and dry.  Psychiatric: She has a normal mood and  affect. Her behavior is normal. Judgment and thought content normal.      Assessment & Plan:

## 2013-04-05 DIAGNOSIS — F3289 Other specified depressive episodes: Secondary | ICD-10-CM | POA: Insufficient documentation

## 2013-04-05 DIAGNOSIS — N760 Acute vaginitis: Secondary | ICD-10-CM | POA: Insufficient documentation

## 2013-04-05 DIAGNOSIS — B9689 Other specified bacterial agents as the cause of diseases classified elsewhere: Secondary | ICD-10-CM | POA: Insufficient documentation

## 2013-04-05 DIAGNOSIS — M722 Plantar fascial fibromatosis: Secondary | ICD-10-CM | POA: Insufficient documentation

## 2013-04-05 DIAGNOSIS — F329 Major depressive disorder, single episode, unspecified: Secondary | ICD-10-CM | POA: Insufficient documentation

## 2013-04-05 LAB — POCT WET PREP (WET MOUNT)

## 2013-04-05 NOTE — Assessment & Plan Note (Signed)
She was given prescriptions for Zorvolex and Vimovo and is to make an appointment with Dr. Ralene Cork or Dr. Delford Field.

## 2013-04-05 NOTE — Assessment & Plan Note (Addendum)
A wet prep was done which shows that she is clear of trichomoniasis but she does have BV.  She was given prescriptions for both Metrogel and clindamycin cream.   We also discussed having her use boric acid douche to help clean out her discharge and help with the pH.  She is to also try some of the RePhresh products.

## 2013-04-05 NOTE — Assessment & Plan Note (Signed)
Refilled her Prozac.   

## 2013-04-05 NOTE — Assessment & Plan Note (Signed)
Refilled her Concerta 

## 2013-04-15 ENCOUNTER — Ambulatory Visit: Payer: Commercial Managed Care - PPO | Admitting: Family Medicine

## 2013-04-22 ENCOUNTER — Ambulatory Visit (INDEPENDENT_AMBULATORY_CARE_PROVIDER_SITE_OTHER): Payer: Commercial Managed Care - PPO

## 2013-04-22 VITALS — BP 187/100 | HR 76 | Resp 20 | Ht 63.0 in | Wt 270.0 lb

## 2013-04-22 DIAGNOSIS — R52 Pain, unspecified: Secondary | ICD-10-CM

## 2013-04-22 DIAGNOSIS — M722 Plantar fascial fibromatosis: Secondary | ICD-10-CM

## 2013-04-22 MED ORDER — MELOXICAM 15 MG PO TABS: 15.0000 mg | ORAL_TABLET | Freq: Every day | ORAL | Status: DC

## 2013-04-22 NOTE — Progress Notes (Signed)
   Subjective:    Patient ID: Joanne Soto, female    DOB: 1980-08-02, 32 y.o.   MRN: [id number]  "It's pain in both feet mainly in the heel area."  Foot Pain This is a new (Heel Pain B/L) problem. Episode onset: more than 3 years. The problem occurs daily. The problem has been gradually worsening. Associated symptoms include a sore throat. The symptoms are aggravated by walking and standing. She has tried NSAIDs and ice (otc inserts, dansko shoes, Naproxen-Esomeprazole, Diclofenac) for the symptoms. The treatment provided mild relief.      Review of Systems  HENT: Positive for sinus pressure and sore throat.   Musculoskeletal: Positive for gait problem.  All other systems reviewed and are negative.       Objective:   Physical Exam Neurovascular status is intact pedal pulses are palpable bilateral Refill time 3 seconds all digits. Neurologically epicritic and proprioceptive sensations intact and symmetric bilateral. Patient has pain on first up in the morning getting a foam. Rest mid medial band of plantar fascia. Most significant pain it inferior heel calcaneal tubercle. Patient also complaints by the end of the day of some leg fatigue possibly due to compensatory gait changes and lipping. Patient currently wearing shoes are extremely flexible including slippers around the house or flexible. Has tried putting heel cups and shoes has been of right NSAIDs which provided some improvement although not complete resolution. X-rays reveal thickening the fashion of no inferior calcaneal spurs no fractures or other injuries noted     Assessment & Plan:  Assessment plantar fasciitis bilateral plan at this time plantar fascial strapping both feet. Prescription for Mobic is called in to her pharmacy. 15 mg once daily maintain ice pack to the heel every evening also recommended crocs for around the house and possibly even at work. No barefoot or flimsy shoes. Recheck in 2 weeks for followup may be  candidate for functional orthoses.  Alvan Dame DPM

## 2013-04-22 NOTE — Patient Instructions (Signed)

## 2013-04-27 DIAGNOSIS — A5901 Trichomonal vulvovaginitis: Secondary | ICD-10-CM | POA: Insufficient documentation

## 2013-04-27 DIAGNOSIS — Z202 Contact with and (suspected) exposure to infections with a predominantly sexual mode of transmission: Secondary | ICD-10-CM | POA: Insufficient documentation

## 2013-04-27 LAB — POCT WET PREP (WET MOUNT)

## 2013-04-28 ENCOUNTER — Telehealth: Payer: Self-pay | Admitting: *Deleted

## 2013-04-28 NOTE — Telephone Encounter (Signed)
Pt asked if her medication was called to the pharmacy.  I checked and it was sent to the [facility] on 04/22/2013.

## 2013-05-06 ENCOUNTER — Ambulatory Visit (INDEPENDENT_AMBULATORY_CARE_PROVIDER_SITE_OTHER): Payer: Commercial Managed Care - PPO

## 2013-05-06 VITALS — BP 150/90 | HR 62 | Resp 16 | Ht 63.0 in | Wt 273.0 lb

## 2013-05-06 DIAGNOSIS — M722 Plantar fascial fibromatosis: Secondary | ICD-10-CM

## 2013-05-06 DIAGNOSIS — R52 Pain, unspecified: Secondary | ICD-10-CM

## 2013-05-06 NOTE — Patient Instructions (Signed)

## 2013-05-06 NOTE — Progress Notes (Signed)
   Subjective:    Patient ID: Joanne Soto, female    DOB: [birth year], 32 y.o.   MRN: [id number]  "I want you to tape them again, that made such a huge difference."  HPI    Review of Systems deferred at this visit    Objective:   Physical Exam Neurovascular status is intact pedal pulses palpable. Patient significant improvement with a fascial strapping and placed at this time is candidate for functional orthoses in repeat strapping. Cyst or persistent plantar fasciitis/heel spur syndrome. Neurovascular status intact patient will undergo a strapping at this time after orthotic scan is carried out patient will be contacted with the next month when orthotics rate for fitting and adjustments if needed in the future.       Assessment & Plan:  Assessment plantar fasciitis/heel spur syndrome respond to fascial strapping therefore should respond to functional orthoses orthotics skin carried out at this time. Functional orthotic with Spenco top cover the reorder with the rear foot extrinsic posting. Patient be called when orthotics ready for fitting with the next month. After skin is carried out both feet are placed a fascial strapping to maintain for another 5 days provided temporary relief and orthotics are ready.  Alvan Dame DPM

## 2013-06-03 ENCOUNTER — Ambulatory Visit: Payer: Commercial Managed Care - PPO | Admitting: Family Medicine

## 2013-06-11 ENCOUNTER — Ambulatory Visit (INDEPENDENT_AMBULATORY_CARE_PROVIDER_SITE_OTHER): Payer: Commercial Managed Care - PPO

## 2013-06-11 VITALS — BP 126/64 | HR 69 | Resp 12

## 2013-06-11 DIAGNOSIS — R52 Pain, unspecified: Secondary | ICD-10-CM

## 2013-06-11 DIAGNOSIS — M722 Plantar fascial fibromatosis: Secondary | ICD-10-CM

## 2013-06-11 NOTE — Patient Instructions (Signed)

## 2013-06-11 NOTE — Progress Notes (Signed)
   Subjective:    Patient ID: Joanne Soto, female    DOB: [birth year], 33 y.o.   MRN: [id number]  HPI Comments: ''. BOTH HEELS STILL HURTING''  DISPENSED OTRHOTICS AND GIVEN INSTRUCTION.''  still some slight tenderness but not as bad as it had been. Taking Mobic only occasional    Review of Systems no new changes or finding     Objective:   Physical Exam Neurovascular status is intact pedal pulses palpable epicritic and progressive sensations intact and symmetric orthotics fit and contour well patient given oral and written instructions for break-in use of the orthoses at this time       Assessment & Plan:  Assessment plantar fasciitis/heel spur syndrome orthotics are dispensed with break in and wearing will followup as needed suggested using an NSAID such as Mobic during the break in period also ice to the heels after a day of walking or activities recheck in one to 2 months if needed  Alvan Dameichard Willis Holquin DPM

## 2013-06-16 ENCOUNTER — Encounter: Payer: Self-pay | Admitting: Family Medicine

## 2013-06-16 ENCOUNTER — Ambulatory Visit (INDEPENDENT_AMBULATORY_CARE_PROVIDER_SITE_OTHER): Payer: Commercial Managed Care - PPO | Admitting: Family Medicine

## 2013-06-16 VITALS — BP 131/94 | HR 61 | Ht 63.0 in | Wt 276.0 lb

## 2013-06-16 DIAGNOSIS — F3289 Other specified depressive episodes: Secondary | ICD-10-CM

## 2013-06-16 DIAGNOSIS — R41840 Attention and concentration deficit: Secondary | ICD-10-CM

## 2013-06-16 DIAGNOSIS — F329 Major depressive disorder, single episode, unspecified: Secondary | ICD-10-CM

## 2013-06-16 DIAGNOSIS — Z3009 Encounter for other general counseling and advice on contraception: Secondary | ICD-10-CM

## 2013-06-16 DIAGNOSIS — Z30011 Encounter for initial prescription of contraceptive pills: Secondary | ICD-10-CM

## 2013-06-16 MED ORDER — LISDEXAMFETAMINE DIMESYLATE 70 MG PO CAPS: 70.0000 mg | ORAL_CAPSULE | Freq: Every day | ORAL | Status: DC

## 2013-06-16 MED ORDER — FLUOXETINE HCL 40 MG PO CAPS: 40.0000 mg | ORAL_CAPSULE | Freq: Every day | ORAL | Status: AC

## 2013-06-16 MED ORDER — NORETHINDRONE ACET-ETHINYL EST 1-20 MG-MCG PO TABS: 1.0000 | ORAL_TABLET | Freq: Every day | ORAL | Status: AC

## 2013-06-16 NOTE — Progress Notes (Signed)
Subjective:    Patient ID: Joanne Soto, female    DOB: 31-May-1980, 33 y.o.   MRN: [id number]  HPI  Joanne Soto is here today to get some of her medications refilled.   1)  ADD - She has been taking Concerta (54 mg daily) and feels that it is not working as well as it used to.  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  She thinks that she did better on Vyvanse and would like to go back on it.    2)  Oral Contraceptives - She is doing well with her Junel and needs a refill on it.    3)  Mood - She is currently taking Prozac (40 mg, every other day).  She wants to be switched to 20 mg so she can take it daily.    Review of Systems  Psychiatric/Behavioral: Positive for decreased concentration.  All other systems reviewed and are negative.   Past Medical History  Diagnosis Date  . High risk HPV infection   . HSV-2 (herpes simplex virus 2) infection   . Ovarian cyst   . ADD (attention deficit disorder)      Past Surgical History  Procedure Laterality Date  . Cholecystectomy    . Induced abortion  9/10  . Orif ankle fracture Right 07/22/2013    Procedure: OPEN REDUCTION INTERNAL FIXATION (ORIF) ANKLE FRACTURE;  Surgeon: Sheral Apley, MD;  Location: [facility] OR;  Service: Orthopedics;  Laterality: Right;     History   Social History Narrative   Marital Status: Single Recruitment consultant)    Children:  Joanne Soto)    Pets:  None    Living Situation: Lives with her Joanne and grandmother Joanne Soto).   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  She has smoked off and on since the age of 67. She quit during her pregnancy and did not smoke for 4 years.  She started back in the spring of 2012.  She is currently smoking 1 pack per week.  She would like to quit.     Alcohol Use:  Occasional   Drug Use:  None   Diet:  Regular    Exercise:  Active Job    Hobbies:  Reading               Family History  Problem Relation Age of Onset  . Hypertension Father   . Heart disease Paternal Grandfather   . CVA Paternal Grandfather   . Diabetes Paternal Grandfather   . Hypertension Paternal Grandfather   . Hepatitis C Mother      Current Outpatient Prescriptions on File Prior to Visit  Medication Sig Dispense Refill  . Ascorbic Acid (VITAMIN C) 100 MG tablet Take 100 mg by mouth daily.      . methylphenidate (CONCERTA) 54 MG CR tablet Take 1 tablet (54 mg total) by mouth every morning.  90 tablet  0   No current facility-administered medications on file prior to visit.     No Known Allergies   Immunization History  Administered Date(s) Administered  . Influenza,inj,Quad PF,36+ Mos 01/09/2013  . Tdap 02/28/2011, 07/22/2013       Objective:   Physical Exam  Nursing note and vitals reviewed. Constitutional: She appears well-nourished. No distress.  Cardiovascular: Normal rate, regular rhythm and normal heart  sounds.   Pulmonary/Chest: Effort normal and breath sounds normal.  Neurological: She is alert.  Psychiatric: She has a normal mood and affect. Her behavior is normal. Judgment and thought content normal.      Assessment & Plan:    Joanne Soto was seen today for medication management.  Diagnoses and associated orders for this visit:  Depressive disorder, not elsewhere classified - FLUoxetine (PROZAC) 40 MG capsule; Take 1 capsule (40 mg total) by mouth daily.  Concentration deficit - lisdexamfetamine (VYVANSE) 70 MG capsule; Take 1 capsule (70 mg total) by mouth daily.  Oral contraceptive prescribed - norethindrone-ethinyl estradiol (MICROGESTIN,JUNEL,LOESTRIN) 1-20 MG-MCG tablet; Take 1 tablet by mouth daily.   TIME SPENT "FACE TO FACE" WITH PATIENT -  30 MINS

## 2013-07-22 ENCOUNTER — Encounter (HOSPITAL_COMMUNITY): Payer: Self-pay | Admitting: Emergency Medicine

## 2013-07-22 ENCOUNTER — Encounter (HOSPITAL_COMMUNITY): Payer: No Typology Code available for payment source | Admitting: Anesthesiology

## 2013-07-22 ENCOUNTER — Emergency Department (HOSPITAL_COMMUNITY): Payer: No Typology Code available for payment source

## 2013-07-22 ENCOUNTER — Encounter (HOSPITAL_COMMUNITY)
Admission: EM | Disposition: A | Discharge status: Home Health | Payer: Self-pay | Source: Home / Self Care | Attending: Orthopedic Surgery

## 2013-07-22 ENCOUNTER — Emergency Department (HOSPITAL_COMMUNITY): Payer: No Typology Code available for payment source | Admitting: Anesthesiology

## 2013-07-22 ENCOUNTER — Inpatient Hospital Stay (HOSPITAL_COMMUNITY)
Admission: EM | Admit: 2013-07-22 | Discharge: 2013-07-25 | DRG: 493 | Disposition: A | Discharge status: Home Health | Payer: No Typology Code available for payment source | Attending: Orthopedic Surgery | Admitting: Orthopedic Surgery

## 2013-07-22 DIAGNOSIS — S91009A Unspecified open wound, unspecified ankle, initial encounter: Secondary | ICD-10-CM

## 2013-07-22 DIAGNOSIS — S81009A Unspecified open wound, unspecified knee, initial encounter: Secondary | ICD-10-CM | POA: Diagnosis present

## 2013-07-22 DIAGNOSIS — F329 Major depressive disorder, single episode, unspecified: Secondary | ICD-10-CM | POA: Diagnosis present

## 2013-07-22 DIAGNOSIS — S82899A Other fracture of unspecified lower leg, initial encounter for closed fracture: Secondary | ICD-10-CM | POA: Diagnosis not present

## 2013-07-22 DIAGNOSIS — S81809A Unspecified open wound, unspecified lower leg, initial encounter: Secondary | ICD-10-CM

## 2013-07-22 DIAGNOSIS — S82401A Unspecified fracture of shaft of right fibula, initial encounter for closed fracture: Secondary | ICD-10-CM

## 2013-07-22 DIAGNOSIS — S82301A Unspecified fracture of lower end of right tibia, initial encounter for closed fracture: Secondary | ICD-10-CM

## 2013-07-22 DIAGNOSIS — F3289 Other specified depressive episodes: Secondary | ICD-10-CM | POA: Diagnosis present

## 2013-07-22 DIAGNOSIS — S82873A Displaced pilon fracture of unspecified tibia, initial encounter for closed fracture: Secondary | ICD-10-CM | POA: Diagnosis present

## 2013-07-22 DIAGNOSIS — Z6841 Body Mass Index (BMI) 40.0 and over, adult: Secondary | ICD-10-CM

## 2013-07-22 DIAGNOSIS — F988 Other specified behavioral and emotional disorders with onset usually occurring in childhood and adolescence: Secondary | ICD-10-CM | POA: Diagnosis present

## 2013-07-22 DIAGNOSIS — S93326A Dislocation of tarsometatarsal joint of unspecified foot, initial encounter: Secondary | ICD-10-CM

## 2013-07-22 DIAGNOSIS — S81019A Laceration without foreign body, unspecified knee, initial encounter: Secondary | ICD-10-CM

## 2013-07-22 DIAGNOSIS — M79609 Pain in unspecified limb: Secondary | ICD-10-CM | POA: Diagnosis not present

## 2013-07-22 DIAGNOSIS — F17200 Nicotine dependence, unspecified, uncomplicated: Secondary | ICD-10-CM | POA: Diagnosis present

## 2013-07-22 DIAGNOSIS — Z79899 Other long term (current) drug therapy: Secondary | ICD-10-CM

## 2013-07-22 DIAGNOSIS — S92301A Fracture of unspecified metatarsal bone(s), right foot, initial encounter for closed fracture: Secondary | ICD-10-CM

## 2013-07-22 HISTORY — PX: ORIF ANKLE FRACTURE: SHX5408

## 2013-07-22 LAB — CBC WITH DIFFERENTIAL/PLATELET
BASOS ABS: 0 10*3/uL (ref 0.0–0.1)
BASOS PCT: 0 % (ref 0–1)
Eosinophils Absolute: 0.7 10*3/uL (ref 0.0–0.7)
Eosinophils Relative: 4 % (ref 0–5)
HCT: 37.9 % (ref 36.0–46.0)
Hemoglobin: 13.1 g/dL (ref 12.0–15.0)
LYMPHS PCT: 20 % (ref 12–46)
Lymphs Abs: 3.9 10*3/uL (ref 0.7–4.0)
MCH: 29.9 pg (ref 26.0–34.0)
MCHC: 34.6 g/dL (ref 30.0–36.0)
MCV: 86.5 fL (ref 78.0–100.0)
MONO ABS: 1.2 10*3/uL — AB (ref 0.1–1.0)
Monocytes Relative: 6 % (ref 3–12)
NEUTROS ABS: 14 10*3/uL — AB (ref 1.7–7.7)
Neutrophils Relative %: 70 % (ref 43–77)
PLATELETS: 299 10*3/uL (ref 150–400)
RBC: 4.38 MIL/uL (ref 3.87–5.11)
RDW: 12.7 % (ref 11.5–15.5)
WBC: 19.9 10*3/uL — ABNORMAL HIGH (ref 4.0–10.5)

## 2013-07-22 LAB — I-STAT CHEM 8, ED
BUN: 11 mg/dL (ref 6–23)
CALCIUM ION: 1.14 mmol/L (ref 1.12–1.23)
Chloride: 102 mEq/L (ref 96–112)
Creatinine, Ser: 0.6 mg/dL (ref 0.50–1.10)
Glucose, Bld: 132 mg/dL — ABNORMAL HIGH (ref 70–99)
HEMATOCRIT: 40 % (ref 36.0–46.0)
Hemoglobin: 13.6 g/dL (ref 12.0–15.0)
Potassium: 4.1 mEq/L (ref 3.7–5.3)
SODIUM: 140 meq/L (ref 137–147)
TCO2: 23 mmol/L (ref 0–100)

## 2013-07-22 LAB — POC URINE PREG, ED: PREG TEST UR: NEGATIVE

## 2013-07-22 SURGERY — OPEN REDUCTION INTERNAL FIXATION (ORIF) ANKLE FRACTURE
Anesthesia: General | Site: Ankle | Laterality: Right

## 2013-07-22 MED ORDER — LIDOCAINE HCL (CARDIAC) 20 MG/ML IV SOLN
INTRAVENOUS | Status: DC | PRN
  Administered 2013-07-22: 100 mg via INTRAVENOUS

## 2013-07-22 MED ORDER — HYDROMORPHONE HCL PF 1 MG/ML IJ SOLN
1.0000 mg | Freq: Once | INTRAMUSCULAR | Status: AC
  Administered 2013-07-22: 1 mg via INTRAVENOUS
  Filled 2013-07-22: qty 1

## 2013-07-22 MED ORDER — ONDANSETRON HCL 4 MG/2ML IJ SOLN
INTRAMUSCULAR | Status: DC | PRN
  Administered 2013-07-22: 4 mg via INTRAVENOUS

## 2013-07-22 MED ORDER — GLYCOPYRROLATE 0.2 MG/ML IJ SOLN
INTRAMUSCULAR | Status: AC
  Filled 2013-07-22: qty 3

## 2013-07-22 MED ORDER — FENTANYL CITRATE 0.05 MG/ML IJ SOLN
INTRAMUSCULAR | Status: AC
  Filled 2013-07-22: qty 5

## 2013-07-22 MED ORDER — TETANUS-DIPHTH-ACELL PERTUSSIS 5-2.5-18.5 LF-MCG/0.5 IM SUSP
0.5000 mL | Freq: Once | INTRAMUSCULAR | Status: AC
  Administered 2013-07-22: 0.5 mL via INTRAMUSCULAR
  Filled 2013-07-22: qty 0.5

## 2013-07-22 MED ORDER — CEFAZOLIN SODIUM 1-5 GM-% IV SOLN
INTRAVENOUS | Status: AC
Start: 2013-07-22 — End: 2013-07-23
  Filled 2013-07-22: qty 50

## 2013-07-22 MED ORDER — DEXAMETHASONE SODIUM PHOSPHATE 4 MG/ML IJ SOLN
INTRAMUSCULAR | Status: DC | PRN
  Administered 2013-07-22: 8 mg via INTRAVENOUS

## 2013-07-22 MED ORDER — PHENYLEPHRINE HCL 10 MG/ML IJ SOLN
INTRAMUSCULAR | Status: DC | PRN
  Administered 2013-07-22 (×3): 40 ug via INTRAVENOUS

## 2013-07-22 MED ORDER — PROPOFOL 10 MG/ML IV BOLUS
INTRAVENOUS | Status: AC
  Filled 2013-07-22: qty 20

## 2013-07-22 MED ORDER — ARTIFICIAL TEARS OP OINT
TOPICAL_OINTMENT | OPHTHALMIC | Status: DC | PRN
  Administered 2013-07-22: 1 via OPHTHALMIC

## 2013-07-22 MED ORDER — HYDROMORPHONE HCL PF 1 MG/ML IJ SOLN
INTRAMUSCULAR | Status: AC
  Filled 2013-07-22: qty 1

## 2013-07-22 MED ORDER — DEXAMETHASONE SODIUM PHOSPHATE 4 MG/ML IJ SOLN
INTRAMUSCULAR | Status: AC
  Filled 2013-07-22: qty 2

## 2013-07-22 MED ORDER — SUCCINYLCHOLINE CHLORIDE 20 MG/ML IJ SOLN
INTRAMUSCULAR | Status: DC | PRN
  Administered 2013-07-22: 120 mg via INTRAVENOUS

## 2013-07-22 MED ORDER — NEOSTIGMINE METHYLSULFATE 1 MG/ML IJ SOLN
INTRAMUSCULAR | Status: DC | PRN
  Administered 2013-07-22: 5 mg via INTRAVENOUS

## 2013-07-22 MED ORDER — LACTATED RINGERS IV SOLN
INTRAVENOUS | Status: DC | PRN
  Administered 2013-07-22 (×2): via INTRAVENOUS

## 2013-07-22 MED ORDER — ROCURONIUM BROMIDE 100 MG/10ML IV SOLN
INTRAVENOUS | Status: DC | PRN
  Administered 2013-07-22: 10 mg via INTRAVENOUS
  Administered 2013-07-22: 50 mg via INTRAVENOUS
  Administered 2013-07-22 (×2): 10 mg via INTRAVENOUS

## 2013-07-22 MED ORDER — NEOSTIGMINE METHYLSULFATE 1 MG/ML IJ SOLN
INTRAMUSCULAR | Status: AC
  Filled 2013-07-22: qty 20

## 2013-07-22 MED ORDER — FENTANYL CITRATE 0.05 MG/ML IJ SOLN
INTRAMUSCULAR | Status: DC | PRN
  Administered 2013-07-22 (×5): 50 ug via INTRAVENOUS
  Administered 2013-07-22: 100 ug via INTRAVENOUS
  Administered 2013-07-22 (×3): 50 ug via INTRAVENOUS

## 2013-07-22 MED ORDER — ONDANSETRON HCL 4 MG/2ML IJ SOLN
INTRAMUSCULAR | Status: AC
  Filled 2013-07-22: qty 2

## 2013-07-22 MED ORDER — ONDANSETRON HCL 4 MG/2ML IJ SOLN
4.0000 mg | Freq: Once | INTRAMUSCULAR | Status: AC
  Administered 2013-07-22: 4 mg via INTRAVENOUS
  Filled 2013-07-22: qty 2

## 2013-07-22 MED ORDER — DEXTROSE 5 % IV SOLN
3.0000 g | INTRAVENOUS | Status: DC | PRN
  Administered 2013-07-22: 3 g via INTRAVENOUS

## 2013-07-22 MED ORDER — 0.9 % SODIUM CHLORIDE (POUR BTL) OPTIME
TOPICAL | Status: DC | PRN
  Administered 2013-07-22: 1000 mL

## 2013-07-22 MED ORDER — ROCURONIUM BROMIDE 50 MG/5ML IV SOLN
INTRAVENOUS | Status: AC
  Filled 2013-07-22: qty 1

## 2013-07-22 MED ORDER — IOHEXOL 300 MG/ML  SOLN
100.0000 mL | Freq: Once | INTRAMUSCULAR | Status: AC | PRN
  Administered 2013-07-22: 100 mL via INTRAVENOUS

## 2013-07-22 MED ORDER — ONDANSETRON HCL 4 MG/2ML IJ SOLN
4.0000 mg | Freq: Once | INTRAMUSCULAR | Status: AC
Start: 2013-07-22 — End: 2013-07-22
  Administered 2013-07-22: 4 mg via INTRAVENOUS
  Filled 2013-07-22: qty 2

## 2013-07-22 MED ORDER — SODIUM CHLORIDE 0.9 % IV SOLN
INTRAVENOUS | Status: DC | PRN
  Administered 2013-07-22: 21:00:00 via INTRAVENOUS

## 2013-07-22 MED ORDER — PROPOFOL 10 MG/ML IV BOLUS
INTRAVENOUS | Status: DC | PRN
  Administered 2013-07-22: 300 mg via INTRAVENOUS

## 2013-07-22 MED ORDER — GLYCOPYRROLATE 0.2 MG/ML IJ SOLN
INTRAMUSCULAR | Status: DC | PRN
  Administered 2013-07-22: .7 mg via INTRAVENOUS

## 2013-07-22 MED ORDER — HYDROMORPHONE HCL PF 1 MG/ML IJ SOLN
1.0000 mg | Freq: Once | INTRAMUSCULAR | Status: AC
  Administered 2013-07-22: 1 mg via INTRAVENOUS

## 2013-07-22 SURGICAL SUPPLY — 80 items
BANDAGE ELASTIC 4 VELCRO ST LF (GAUZE/BANDAGES/DRESSINGS) ×4 IMPLANT
BANDAGE ELASTIC 6 VELCRO ST LF (GAUZE/BANDAGES/DRESSINGS) ×3 IMPLANT
BIT DRILL 2.5X125 (BIT) ×3 IMPLANT
BIT DRILL 3.1X204 (BIT) ×3 IMPLANT
BIT DRILL CANN 2.7 (BIT) ×3
BIT DRILL CANN 2.7MM (BIT) ×1
BIT DRILL SRG 2.7XCANN AO CPLG (BIT) ×1 IMPLANT
BIT DRL SRG 2.7XCANN AO CPLNG (BIT) ×2
CANISTER SUCTION 2500CC (MISCELLANEOUS) ×3 IMPLANT
CLOSURE STERI-STRIP 1/2X4 (GAUZE/BANDAGES/DRESSINGS) ×2
CLSR STERI-STRIP ANTIMIC 1/2X4 (GAUZE/BANDAGES/DRESSINGS) ×4 IMPLANT
COVER SURGICAL LIGHT HANDLE (MISCELLANEOUS) ×4 IMPLANT
DRAPE C-ARM 42X72 X-RAY (DRAPES) ×4 IMPLANT
DRAPE C-ARMOR (DRAPES) ×4 IMPLANT
DRAPE ORTHO SPLIT 77X108 STRL (DRAPES) ×8
DRAPE SURG ORHT 6 SPLT 77X108 (DRAPES) ×4 IMPLANT
DRAPE U-SHAPE 47X51 STRL (DRAPES) ×4 IMPLANT
DURAPREP 26ML APPLICATOR (WOUND CARE) ×4 IMPLANT
ELECT REM PT RETURN 9FT ADLT (ELECTROSURGICAL)
ELECTRODE REM PT RTRN 9FT ADLT (ELECTROSURGICAL) IMPLANT
GAUZE XEROFORM 5X9 LF (GAUZE/BANDAGES/DRESSINGS) ×4 IMPLANT
GLOVE BIO SURGEON STRL SZ8 (GLOVE) ×7 IMPLANT
GLOVE BIOGEL PI IND STRL 6.5 (GLOVE) ×1 IMPLANT
GLOVE BIOGEL PI IND STRL 7.5 (GLOVE) ×1 IMPLANT
GLOVE BIOGEL PI IND STRL 8 (GLOVE) ×2 IMPLANT
GLOVE BIOGEL PI INDICATOR 6.5 (GLOVE) ×2
GLOVE BIOGEL PI INDICATOR 7.5 (GLOVE) ×2
GLOVE BIOGEL PI INDICATOR 8 (GLOVE) ×2
GLOVE ORTHO TXT STRL SZ7.5 (GLOVE) ×4 IMPLANT
GLOVE SURG SS PI 7.5 STRL IVOR (GLOVE) ×9 IMPLANT
GOWN STRL REUS W/ TWL LRG LVL3 (GOWN DISPOSABLE) ×2 IMPLANT
GOWN STRL REUS W/ TWL XL LVL3 (GOWN DISPOSABLE) ×8 IMPLANT
GOWN STRL REUS W/TWL LRG LVL3 (GOWN DISPOSABLE) ×4
GOWN STRL REUS W/TWL XL LVL3 (GOWN DISPOSABLE) ×16
K-WIRE ORTHOPEDIC 1.4X150L (WIRE) ×4
KIT BASIN OR (CUSTOM PROCEDURE TRAY) ×4 IMPLANT
KIT ROOM TURNOVER OR (KITS) ×4 IMPLANT
KWIRE ORTHOPEDIC 1.4X150L (WIRE) ×2 IMPLANT
MANIFOLD NEPTUNE II (INSTRUMENTS) ×1 IMPLANT
NS IRRIG 1000ML POUR BTL (IV SOLUTION) ×4 IMPLANT
PACK ORTHO EXTREMITY (CUSTOM PROCEDURE TRAY) ×4 IMPLANT
PAD ARMBOARD 7.5X6 YLW CONV (MISCELLANEOUS) ×8 IMPLANT
PAD CAST 4YDX4 CTTN HI CHSV (CAST SUPPLIES) ×1 IMPLANT
PADDING CAST ABS 4INX4YD NS (CAST SUPPLIES) ×2
PADDING CAST ABS 6INX4YD NS (CAST SUPPLIES) ×2
PADDING CAST ABS COTTON 4X4 ST (CAST SUPPLIES) ×1 IMPLANT
PADDING CAST ABS COTTON 6X4 NS (CAST SUPPLIES) ×2 IMPLANT
PADDING CAST COTTON 4X4 STRL (CAST SUPPLIES) ×4
PADDING CAST COTTON 6X4 STRL (CAST SUPPLIES) ×4 IMPLANT
PLATE ANT LAT TIBIA RT (Plate) ×4 IMPLANT
PLATE TUBULAR 8 HOLE 1/3 (Plate) ×3 IMPLANT
SCREW CANN 4.0X42MM (Screw) ×3 IMPLANT
SCREW CORTEX ST MATTA 3.5X14 (Screw) ×4 IMPLANT
SCREW CORTEX ST MATTA 3.5X16MM (Screw) ×16 IMPLANT
SCREW CORTEX ST MATTA 3.5X18MM (Screw) ×3 IMPLANT
SCREW CORTEX ST MATTA 3.5X28MM (Screw) ×9 IMPLANT
SCREW CORTEX ST MATTA 3.5X30MM (Screw) ×8 IMPLANT
SCREW CORTICAL 3.5X42MM (Screw) ×4 IMPLANT
SCREW LOCKING 4.0X40MM (Screw) ×8 IMPLANT
SCREW LOCKING 44MM (Screw) ×3 IMPLANT
SPLINT PLASTER CAST XFAST 5X30 (CAST SUPPLIES) ×2 IMPLANT
SPLINT PLASTER XFAST SET 5X30 (CAST SUPPLIES) ×2
SPONGE GAUZE 4X4 12PLY (GAUZE/BANDAGES/DRESSINGS) ×7 IMPLANT
SPONGE LAP 18X18 X RAY DECT (DISPOSABLE) ×4 IMPLANT
SUCTION FRAZIER TIP 8 FR DISP (SUCTIONS) ×2
SUCTION TUBE FRAZIER 8FR DISP (SUCTIONS) ×1 IMPLANT
SUT ETHILON 2 0 FS 18 (SUTURE) IMPLANT
SUT ETHILON 3 0 PS 1 (SUTURE) ×8 IMPLANT
SUT MON AB 2-0 CT1 36 (SUTURE) ×8 IMPLANT
SUT VIC AB 0 CT1 27 (SUTURE) ×8
SUT VIC AB 0 CT1 27XBRD ANBCTR (SUTURE) ×4 IMPLANT
SUT VIC AB 2-0 CT1 27 (SUTURE)
SUT VIC AB 2-0 CT1 TAPERPNT 27 (SUTURE) IMPLANT
TOWEL OR 17X24 6PK STRL BLUE (TOWEL DISPOSABLE) ×4 IMPLANT
TOWEL OR 17X26 10 PK STRL BLUE (TOWEL DISPOSABLE) ×4 IMPLANT
TUBE CONNECTING 12'X1/4 (SUCTIONS) ×1
TUBE CONNECTING 12X1/4 (SUCTIONS) ×2 IMPLANT
UNDERPAD 30X30 INCONTINENT (UNDERPADS AND DIAPERS) ×4 IMPLANT
WATER STERILE IRR 1000ML POUR (IV SOLUTION) ×4 IMPLANT
YANKAUER SUCT BULB TIP NO VENT (SUCTIONS) ×4 IMPLANT

## 2013-07-22 NOTE — Progress Notes (Signed)
Orthopedic Tech Progress Note Patient Details:  Joanne Soto 10/15/1980 [id number]  Ortho Devices Type of Ortho Device: Ace wrap;Post (short leg) splint;Stirrup splint Ortho Device/Splint Location: RLE Ortho Device/Splint Interventions: Ordered;Application   Jennye MoccasinHughes, [name] 07/22/2013, 3:56 PM
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
## 2013-07-22 NOTE — Anesthesia Procedure Notes (Signed)
Procedure Name: Intubation Date/Time: 07/22/2013 9:10 PM Performed by: Julianne RiceBILOTTA, Derrica Sieg Z Pre-anesthesia Checklist: Patient identified, Timeout performed, Suction available, Emergency Drugs available and Patient being monitored Patient Re-evaluated:Patient Re-evaluated prior to inductionOxygen Delivery Method: Circle system utilized Preoxygenation: Pre-oxygenation with 100% oxygen Intubation Type: IV induction and Cricoid Pressure applied Ventilation: Mask ventilation without difficulty Laryngoscope Size: Mac and 3 Grade View: Grade II Tube type: Oral Tube size: 7.5 mm Number of attempts: 1 Airway Equipment and Method: Stylet Placement Confirmation: ETT inserted through vocal cords under direct vision,  breath sounds checked- equal and bilateral and positive ETCO2 Secured at: 22 cm Tube secured with: Tape Dental Injury: Teeth and Oropharynx as per pre-operative assessment

## 2013-07-22 NOTE — Transfer of Care (Signed)
Immediate Anesthesia Transfer of Care Note  Patient: Joanne Soto  Procedure(s) Performed: Procedure(s): OPEN REDUCTION INTERNAL FIXATION (ORIF) ANKLE FRACTURE (Right)  Patient Location: PACU  Anesthesia Type:General  Level of Consciousness: awake and patient cooperative  Airway & Oxygen Therapy: Patient Spontanous Breathing and Patient connected to face mask oxygen  Post-op Assessment: Report given to PACU RN and Post -op Vital signs reviewed and stable  Post vital signs: Reviewed and stable  Complications: No apparent anesthesia complications

## 2013-07-22 NOTE — Op Note (Signed)
07/22/2013  11:36 PM  PATIENT:  Joanne Soto    PRE-OPERATIVE DIAGNOSIS:  Right Ankle Fracture  POST-OPERATIVE DIAGNOSIS:  Same  PROCEDURE:  OPEN REDUCTION INTERNAL FIXATION (ORIF) ANKLE FRACTURE  SURGEON:  Margarita Rana, D, MD  ASSISTANT: Janace Litten OPA  ANESTHESIA:   Gen  PREOPERATIVE INDICATIONS:  Joanne Soto is a  33 y.o. female with a diagnosis of Right Ankle Fracture who failed conservative measures and elected for surgical management.    The risks benefits and alternatives were discussed with the patient preoperatively including but not limited to the risks of infection, bleeding, nerve injury, cardiopulmonary complications, the need for revision surgery, among others, and the patient was willing to proceed.  OPERATIVE IMPLANTS: Stryker 1/3 tubular, canulated screw, ant lat tibial locking plate  BLOOD LOSS: 150  COMPLICATIONS: none  TOURNIQUET TIME:  OPERATIVE PROCEDURE:  Patient was identified in the preoperative holding area and site was marked by me She was transported to the operating theater and placed on the table in supine position taking care to pad all bony prominences. After a preincinduction time out anesthesia was induced. The Right lower extremity was prepped and draped in normal sterile fashion and a pre-incision timeout was performed. She received 3G ancef for preoperative antibiotics.   Right lower extremity was exsanguinated and the tourniquet was inflated to 3 and 50 mm of mercury. I started with the fibula to obtain appropriate length and made a lateral incision of roughly 6 cm centered over the fibula dissected down carefully to protect the superficial peroneal nerve. I then incised the periosteum the fracture was identified there was some comminution this I elected to bridge plate it I selected an 8 hole one third tubular pulled up to a felt to the appropriate length and placed 3 good cortical bicortical screws above and below the  distal 1 I angled a little superior to avoid the penetrate the articular surface of the fibula. As happy with the reduction here so I then turned my attention to the tibia.   I created a roughly 7 cm anterior lateral incision and dissected down carefully to the fascia over the anterior musculature compartment and incised this fashion but bluntly dissected the muscle apart protecting the neurovascular structures the dorsalis pedis and the peroneal nerve. I elevated the periosteum off the bone identified the fracture the CAT scan to be obtained preoperatively before getting out to length so did look for the joint pieces that had been pushed proximally. We pulled the fibula and tibia out to length the joint pieces came down with it. At this point I elected to place the anterior lateral tibial locking plate selected a plate pinned in place and place her distal locking row the distal piece after placing one front to back lag screw. I then reduced the plate to the shaft and reduced the metaphyseal portion of the fracture back to the diaphysis. Is very happy with this reduction I placed a 3 nonlocking screws into the shaft. I then took multiple views and was very happy with the reduction and placement of hardware was in a slight amount of recurvatum but I felt this was was acceptable given her good fixation and good varus valgus reduction. I then thoroughly irrigated both these wounds and closed the fascia over top of the plates followed by skin in layers using Monocryl suture. Sterile attention to the Lisfranc at this time.  Her Lisfranc joint was really nondisplaced however there was some comminution on this  lateral side of the medial cuneiform so in order to stabilize this I did place a percutaneous partially-threaded cannulated screw across Lisfranc's joint as happy with the place a K wire multiple views as well as the placement of the screw. At shutdown Lisfranc's joint well attempted from widening with stress. I  took multiple views and felt that this is an appropriate amount of fixation. I then thoroughly irrigated this wound and closed it with a nylon.  Sterile dressings were then applied she was placed in a bulky splint.  POST OPERATIVE PLAN: She'll be nonweightbearing she will have SCDs and TED hose and take aspirin 325 daily for DVT prophylaxis.

## 2013-07-22 NOTE — ED Notes (Signed)
Pt alert and talking, pain has improved.
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
## 2013-07-22 NOTE — Anesthesia Preprocedure Evaluation (Signed)
Anesthesia Evaluation  Patient identified by MRN, date of birth, ID band Patient awake    Reviewed: Allergy & Precautions, H&P , NPO status , Patient's Chart, lab work & pertinent test results  Airway       Dental   Pulmonary Current Smoker,          Cardiovascular     Neuro/Psych Depression    GI/Hepatic   Endo/Other  Morbid obesity  Renal/GU      Musculoskeletal   Abdominal   Peds  (+) ATTENTION DEFICIT DISORDER WITHOUT HYPERACTIVITY Hematology   Anesthesia Other Findings   Reproductive/Obstetrics                           Anesthesia Physical Anesthesia Plan  ASA: II  Anesthesia Plan: General   Post-op Pain Management:    Induction: Intravenous  Airway Management Planned: LMA and Oral ETT  Additional Equipment:   Intra-op Plan:   Post-operative Plan: Extubation in OR  Informed Consent: I have reviewed the patients History and Physical, chart, labs and discussed the procedure including the risks, benefits and alternatives for the proposed anesthesia with the patient or authorized representative who has indicated his/her understanding and acceptance.     Plan Discussed with:   Anesthesia Plan Comments:         Anesthesia Quick Evaluation

## 2013-07-23 ENCOUNTER — Inpatient Hospital Stay (HOSPITAL_COMMUNITY): Payer: No Typology Code available for payment source

## 2013-07-23 DIAGNOSIS — S81009A Unspecified open wound, unspecified knee, initial encounter: Secondary | ICD-10-CM | POA: Diagnosis present

## 2013-07-23 DIAGNOSIS — F3289 Other specified depressive episodes: Secondary | ICD-10-CM | POA: Diagnosis present

## 2013-07-23 DIAGNOSIS — S82873A Displaced pilon fracture of unspecified tibia, initial encounter for closed fracture: Secondary | ICD-10-CM | POA: Diagnosis present

## 2013-07-23 DIAGNOSIS — Z79899 Other long term (current) drug therapy: Secondary | ICD-10-CM | POA: Diagnosis not present

## 2013-07-23 DIAGNOSIS — F988 Other specified behavioral and emotional disorders with onset usually occurring in childhood and adolescence: Secondary | ICD-10-CM | POA: Diagnosis present

## 2013-07-23 DIAGNOSIS — Z6841 Body Mass Index (BMI) 40.0 and over, adult: Secondary | ICD-10-CM | POA: Diagnosis not present

## 2013-07-23 DIAGNOSIS — S82899A Other fracture of unspecified lower leg, initial encounter for closed fracture: Secondary | ICD-10-CM | POA: Diagnosis present

## 2013-07-23 DIAGNOSIS — F17200 Nicotine dependence, unspecified, uncomplicated: Secondary | ICD-10-CM | POA: Diagnosis present

## 2013-07-23 DIAGNOSIS — S81809A Unspecified open wound, unspecified lower leg, initial encounter: Secondary | ICD-10-CM | POA: Diagnosis present

## 2013-07-23 DIAGNOSIS — M79609 Pain in unspecified limb: Secondary | ICD-10-CM | POA: Diagnosis present

## 2013-07-23 DIAGNOSIS — F329 Major depressive disorder, single episode, unspecified: Secondary | ICD-10-CM | POA: Diagnosis present

## 2013-07-23 MED ORDER — METHOCARBAMOL 500 MG PO TABS
500.0000 mg | ORAL_TABLET | Freq: Four times a day (QID) | ORAL | Status: DC | PRN
  Administered 2013-07-23 – 2013-07-24 (×4): 500 mg via ORAL
  Filled 2013-07-23 (×3): qty 1

## 2013-07-23 MED ORDER — HYDROMORPHONE HCL PF 1 MG/ML IJ SOLN
INTRAMUSCULAR | Status: AC
  Filled 2013-07-23: qty 1

## 2013-07-23 MED ORDER — NORETHINDRONE ACET-ETHINYL EST 1-20 MG-MCG PO TABS
1.0000 | ORAL_TABLET | Freq: Every day | ORAL | Status: DC
  Administered 2013-07-23: 1 via ORAL

## 2013-07-23 MED ORDER — HYDROMORPHONE HCL PF 1 MG/ML IJ SOLN
0.2500 mg | INTRAMUSCULAR | Status: DC | PRN
  Administered 2013-07-23 (×4): 0.5 mg via INTRAVENOUS

## 2013-07-23 MED ORDER — METHOCARBAMOL 500 MG PO TABS
ORAL_TABLET | ORAL | Status: AC
  Filled 2013-07-23: qty 1

## 2013-07-23 MED ORDER — ONDANSETRON HCL 4 MG/2ML IJ SOLN: 4.0000 mg | Freq: Once | INTRAMUSCULAR | Status: DC | PRN

## 2013-07-23 MED ORDER — METHOCARBAMOL 100 MG/ML IJ SOLN: 500.0000 mg | Freq: Four times a day (QID) | INTRAVENOUS | Status: DC | PRN

## 2013-07-23 MED ORDER — LISDEXAMFETAMINE DIMESYLATE 70 MG PO CAPS
70.0000 mg | ORAL_CAPSULE | Freq: Every day | ORAL | Status: DC
  Filled 2013-07-23: qty 1

## 2013-07-23 MED ORDER — OXYCODONE-ACETAMINOPHEN 5-325 MG PO TABS
1.0000 | ORAL_TABLET | ORAL | Status: DC | PRN
  Administered 2013-07-23 – 2013-07-25 (×13): 2 via ORAL
  Filled 2013-07-23 (×13): qty 2

## 2013-07-23 MED ORDER — HYDROMORPHONE HCL PF 1 MG/ML IJ SOLN
0.5000 mg | INTRAMUSCULAR | Status: DC | PRN
  Administered 2013-07-23 – 2013-07-25 (×17): 1 mg via INTRAVENOUS
  Filled 2013-07-23 (×17): qty 1

## 2013-07-23 MED ORDER — METOCLOPRAMIDE HCL 5 MG PO TABS
5.0000 mg | ORAL_TABLET | Freq: Three times a day (TID) | ORAL | Status: DC | PRN
  Filled 2013-07-23: qty 2

## 2013-07-23 MED ORDER — DEXTROSE-NACL 5-0.45 % IV SOLN
INTRAVENOUS | Status: AC
  Administered 2013-07-23: 1000 mL via INTRAVENOUS

## 2013-07-23 MED ORDER — METHYLPHENIDATE HCL ER (OSM) 18 MG PO TBCR
54.0000 mg | EXTENDED_RELEASE_TABLET | ORAL | Status: DC
  Filled 2013-07-23 (×3): qty 3

## 2013-07-23 MED ORDER — ONDANSETRON HCL 4 MG/2ML IJ SOLN
4.0000 mg | Freq: Four times a day (QID) | INTRAMUSCULAR | Status: DC | PRN
  Administered 2013-07-23: 4 mg via INTRAVENOUS
  Filled 2013-07-23: qty 2

## 2013-07-23 MED ORDER — ONDANSETRON HCL 4 MG PO TABS: 4.0000 mg | ORAL_TABLET | Freq: Four times a day (QID) | ORAL | Status: DC | PRN

## 2013-07-23 MED ORDER — METOCLOPRAMIDE HCL 5 MG/ML IJ SOLN
5.0000 mg | Freq: Three times a day (TID) | INTRAMUSCULAR | Status: DC | PRN
  Administered 2013-07-23: 10 mg via INTRAVENOUS
  Filled 2013-07-23: qty 2

## 2013-07-23 MED ORDER — FLUOXETINE HCL 20 MG PO CAPS
40.0000 mg | ORAL_CAPSULE | Freq: Every day | ORAL | Status: DC
  Administered 2013-07-23 – 2013-07-25 (×3): 40 mg via ORAL
  Filled 2013-07-23 (×3): qty 2

## 2013-07-23 MED ORDER — CEFAZOLIN SODIUM-DEXTROSE 2-3 GM-% IV SOLR
2.0000 g | Freq: Four times a day (QID) | INTRAVENOUS | Status: AC
  Administered 2013-07-23 (×3): 2 g via INTRAVENOUS
  Filled 2013-07-23 (×3): qty 50

## 2013-07-23 MED ORDER — ASPIRIN 81 MG PO CHEW
324.0000 mg | CHEWABLE_TABLET | Freq: Every day | ORAL | Status: DC
  Administered 2013-07-23 – 2013-07-25 (×3): 324 mg via ORAL
  Filled 2013-07-23 (×3): qty 4

## 2013-07-23 NOTE — Progress Notes (Signed)
I participated in the care of this patient and agree with the above history, physical and evaluation. I performed a review of the history and a physical exam as detailed   Rejeana Fadness Daniel Sofia Jaquith MD  

## 2013-07-23 NOTE — Progress Notes (Signed)
Patient ID: Joanne Soto, female   DOB: June 10, 1980, 33 y.o.   MRN: [id number]     Subjective:  Patient reports pain as mild to moderate.  Patient sitting up in bed and in no acute distress.    Objective:   VITALS:   Filed Vitals:   07/23/13 0122 07/23/13 0226 07/23/13 0355 07/23/13 0609  BP: 122/68 110/76 150/118 121/57  Pulse: 101 105 115 116  Temp: 98.2 F (36.8 C) 98 F (36.7 C) 98.3 F (36.8 C) 97.8 F (36.6 C)  TempSrc: Oral Oral Oral Oral  Resp: 18 18 18 18   Height:      Weight:      SpO2: 97% 98% 99% 99%    ABD soft Sensation intact distally Dorsiflexion/Plantar flexion intact Incision: dressing C/D/I and no drainage Short leg splint in place and well fitted.   Lab Results  Component Value Date   WBC 19.9* 07/22/2013   HGB 13.6 07/22/2013   HCT 40.0 07/22/2013   MCV 86.5 07/22/2013   PLT 299 07/22/2013     Assessment/Plan: 1 Day Post-Op   Active Problems:   Pilon fracture   Advance diet Up with therapy Plan for discharge tomorrow NWB Splint at all times    Haskel KhanDOUGLAS Joanne Soto 07/23/2013, 9:04 AM   Joanne Ranaimothy Murphy MD [phone]

## 2013-07-23 NOTE — Clinical Social Work Note (Signed)
CSW received consult for possible SNF placement at time of discharge. PT is recommending home with home health services at time of discharge. CSW signing off. Please reconsult if discharge disposition changes.  Darlyn ChamberEmily [place], LCSWA Clinical Social Worker [phone]

## 2013-07-23 NOTE — Progress Notes (Signed)
   CARE MANAGEMENT NOTE 07/23/2013  Patient:  Joanne Soto,Joanne Soto   Account Number:  [id number]  Date Initiated:  07/23/2013  Documentation initiated by:  Jiles CrockerHANDLER,Tita Terhaar  Subjective/Objective Assessment:   ADMITTED FOR ANKLE SURGERY / ORIF     Action/Plan:   CM FOLLOWING FOR DCP   Anticipated DC Date:  07/24/2013   Anticipated DC Plan:  HOME W HOME HEALTH SERVICES      DC Planning Services  CM consult      Choice offered to / List presented to:  C-1 Patient   DME arranged  Dan HumphreysWALKER      DME agency  [facility]     HH arranged  [facility]      [facility]   Status of service:  In process, will continue to follow Medicare Important Message given?  NA - LOS <3 / Initial given by admissions (If response is "NO", the following Medicare IM given date fields will be blank)  Discharge Disposition:  HOME W HOME HEALTH SERVICES  Per UR Regulation:  Reviewed for med. necessity/level of care/duration of stay  Comments:  07/23/2013- DME ORDERED / KNEE WALKER AND HHPT; B Taz Vanness RN,BSN,MHA [phone]

## 2013-07-23 NOTE — Anesthesia Postprocedure Evaluation (Signed)
  Anesthesia Post-op Note  Patient: Cyndie MullElizabeth J Folks  Procedure(s) Performed: Procedure(s): OPEN REDUCTION INTERNAL FIXATION (ORIF) ANKLE FRACTURE (Right)  Patient Location: PACU  Anesthesia Type:General  Level of Consciousness: awake, alert , oriented and patient cooperative  Airway and Oxygen Therapy: Patient Spontanous Breathing  Post-op Pain: moderate  Post-op Assessment: Post-op Vital signs reviewed, Patient's Cardiovascular Status Stable, Respiratory Function Stable, Patent Airway, No signs of Nausea or vomiting and Pain level controlled  Post-op Vital Signs: stable  Complications: No apparent anesthesia complications
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
## 2013-07-24 ENCOUNTER — Encounter (HOSPITAL_COMMUNITY): Payer: Self-pay | Admitting: Orthopedic Surgery

## 2013-07-24 DIAGNOSIS — S82899A Other fracture of unspecified lower leg, initial encounter for closed fracture: Secondary | ICD-10-CM | POA: Diagnosis not present

## 2013-07-24 DIAGNOSIS — M79609 Pain in unspecified limb: Secondary | ICD-10-CM | POA: Diagnosis not present

## 2013-07-24 MED ORDER — DOCUSATE SODIUM 100 MG PO CAPS: 100.0000 mg | ORAL_CAPSULE | Freq: Two times a day (BID) | ORAL | Status: DC

## 2013-07-24 MED ORDER — ONDANSETRON HCL 4 MG PO TABS: 4.0000 mg | ORAL_TABLET | Freq: Three times a day (TID) | ORAL | Status: DC | PRN

## 2013-07-24 MED ORDER — ASPIRIN 81 MG PO TABS: 81.0000 mg | ORAL_TABLET | Freq: Every day | ORAL | Status: DC

## 2013-07-24 MED ORDER — OXYCODONE HCL 5 MG PO TABS: 10.0000 mg | ORAL_TABLET | ORAL | Status: DC | PRN
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
## 2013-07-24 NOTE — Progress Notes (Signed)
Joanne FentonJerome with [facility] called for delivery of wheelchair to patient's room today; Patient is for discharge home today; Alexis GoodellB Rhea Thrun RN,BSN,MHA [phone]

## 2013-07-24 NOTE — Progress Notes (Signed)
I participated in the care of this patient and agree with the above history, physical and evaluation. I performed a review of the history and a physical exam as detailed   Timothy Daniel Murphy MD  

## 2013-07-24 NOTE — Discharge Instructions (Signed)
No weight on your right foot  Elevate as much as possible  Take ASA 81mg  daily for 30 days
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
## 2013-07-25 NOTE — Progress Notes (Signed)
Nutrition Brief Note  Patient identified on the Malnutrition Screening Tool (MST) Report  Wt Readings from Last 15 Encounters:  07/22/13 280 lb (127.007 kg)  07/22/13 280 lb (127.007 kg)  06/16/13 276 lb (125.193 kg)  05/06/13 273 lb (123.832 kg)  04/22/13 270 lb (122.471 kg)  04/04/13 273 lb (123.832 kg)  02/18/13 268 lb (121.564 kg)  10/09/12 239 lb (108.41 kg)  02/07/07 234 lb (106.142 kg)    Body mass index is 49.61 kg/(m^2). Patient meets criteria for Morbid Obesity based on current BMI. MST filed inaccurately; pt has been gaining weight, not losing.  Current diet order is Regular, patient is consuming approximately 50% of meals at this time. Labs and medications reviewed. Per chart, pt is being discharged.   No nutrition interventions warranted at this time. If nutrition issues arise, please consult RD.   Ian Malkineanne Barnett RD, LDN Inpatient Clinical Dietitian Pager: [phone] After Hours Pager: [phone]

## 2013-07-25 NOTE — Plan of Care (Signed)
D/c instructions given to pt. V/u. Med scripts given to pt. Iv removed. Pt is stable to be d/c.
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
## 2013-08-01 NOTE — Progress Notes (Signed)
I participated in the care of this patient and agree with the above history, physical and evaluation. I performed a review of the history and a physical exam as detailed   Horton Ellithorpe Daniel Caryl Manas MD  

## 2013-08-26 ENCOUNTER — Other Ambulatory Visit: Payer: Self-pay | Admitting: Family Medicine

## 2013-08-26 ENCOUNTER — Encounter: Payer: Self-pay | Admitting: Family Medicine

## 2013-08-26 ENCOUNTER — Ambulatory Visit (INDEPENDENT_AMBULATORY_CARE_PROVIDER_SITE_OTHER): Payer: Medicaid Other | Admitting: Family Medicine

## 2013-08-26 ENCOUNTER — Ambulatory Visit (HOSPITAL_BASED_OUTPATIENT_CLINIC_OR_DEPARTMENT_OTHER)
Admission: RE | Admit: 2013-08-26 | Discharge: 2013-08-26 | Disposition: A | Discharge status: Home / Self Care | Payer: Medicaid Other | Source: Ambulatory Visit | Attending: Family Medicine | Admitting: Family Medicine

## 2013-08-26 VITALS — BP 129/86 | HR 98 | Resp 16

## 2013-08-26 DIAGNOSIS — N644 Mastodynia: Secondary | ICD-10-CM

## 2013-08-26 DIAGNOSIS — N61 Mastitis without abscess: Secondary | ICD-10-CM

## 2013-08-26 LAB — CBC
HCT: 36.2 % (ref 36.0–46.0)
Hemoglobin: 11.9 g/dL — ABNORMAL LOW (ref 12.0–15.0)
MCH: 29.5 pg (ref 26.0–34.0)
MCHC: 32.9 g/dL (ref 30.0–36.0)
MCV: 89.6 fL (ref 78.0–100.0)
Platelets: 404 10*3/uL — ABNORMAL HIGH (ref 150–400)
RBC: 4.04 MIL/uL (ref 3.87–5.11)
RDW: 12.7 % (ref 11.5–15.5)
WBC: 16.3 10*3/uL — ABNORMAL HIGH (ref 4.0–10.5)

## 2013-08-26 LAB — DIFFERENTIAL
Basophils Absolute: 0 10*3/uL (ref 0.0–0.1)
Basophils Relative: 0 % (ref 0–1)
Eosinophils Absolute: 0.2 10*3/uL (ref 0.0–0.7)
Eosinophils Relative: 1 % (ref 0–5)
Lymphocytes Relative: 15 % (ref 12–46)
Lymphs Abs: 2.4 10*3/uL (ref 0.7–4.0)
Monocytes Absolute: 1.3 10*3/uL — ABNORMAL HIGH (ref 0.1–1.0)
Monocytes Relative: 8 % (ref 3–12)
Neutro Abs: 12.4 10*3/uL — ABNORMAL HIGH (ref 1.7–7.7)
Neutrophils Relative %: 76 % (ref 43–77)

## 2013-08-26 MED ORDER — CEPHALEXIN 500 MG PO CAPS: 500.0000 mg | ORAL_CAPSULE | Freq: Four times a day (QID) | ORAL | Status: DC
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
## 2013-08-27 ENCOUNTER — Other Ambulatory Visit: Payer: Self-pay | Admitting: Family Medicine

## 2013-08-27 ENCOUNTER — Ambulatory Visit
Admission: RE | Admit: 2013-08-27 | Discharge: 2013-08-27 | Disposition: A | Discharge status: Home / Self Care | Payer: Self-pay | Source: Ambulatory Visit | Attending: Family Medicine | Admitting: Family Medicine

## 2013-08-27 ENCOUNTER — Other Ambulatory Visit: Payer: Self-pay

## 2013-08-27 DIAGNOSIS — N644 Mastodynia: Secondary | ICD-10-CM

## 2013-09-03 ENCOUNTER — Other Ambulatory Visit: Payer: Self-pay

## 2013-09-04 ENCOUNTER — Ambulatory Visit: Payer: Self-pay | Admitting: Family Medicine

## 2013-10-14 ENCOUNTER — Ambulatory Visit: Payer: No Typology Code available for payment source | Attending: Orthopedic Surgery | Admitting: Physical Therapy

## 2013-10-14 DIAGNOSIS — M25673 Stiffness of unspecified ankle, not elsewhere classified: Secondary | ICD-10-CM | POA: Insufficient documentation

## 2013-10-14 DIAGNOSIS — M25676 Stiffness of unspecified foot, not elsewhere classified: Secondary | ICD-10-CM | POA: Insufficient documentation

## 2013-10-14 DIAGNOSIS — R262 Difficulty in walking, not elsewhere classified: Secondary | ICD-10-CM | POA: Insufficient documentation

## 2013-10-14 DIAGNOSIS — R609 Edema, unspecified: Secondary | ICD-10-CM | POA: Diagnosis not present

## 2013-10-14 DIAGNOSIS — M25579 Pain in unspecified ankle and joints of unspecified foot: Secondary | ICD-10-CM | POA: Diagnosis not present

## 2013-10-14 DIAGNOSIS — IMO0001 Reserved for inherently not codable concepts without codable children: Secondary | ICD-10-CM | POA: Insufficient documentation

## 2013-10-17 ENCOUNTER — Ambulatory Visit: Payer: No Typology Code available for payment source | Admitting: Rehabilitation

## 2013-10-17 DIAGNOSIS — IMO0001 Reserved for inherently not codable concepts without codable children: Secondary | ICD-10-CM | POA: Diagnosis not present

## 2013-10-21 ENCOUNTER — Ambulatory Visit: Payer: No Typology Code available for payment source | Admitting: Rehabilitation

## 2013-10-21 DIAGNOSIS — IMO0001 Reserved for inherently not codable concepts without codable children: Secondary | ICD-10-CM | POA: Diagnosis not present

## 2013-10-24 ENCOUNTER — Ambulatory Visit: Payer: No Typology Code available for payment source | Admitting: Rehabilitation

## 2013-10-24 ENCOUNTER — Ambulatory Visit: Payer: No Typology Code available for payment source | Admitting: Physical Therapy

## 2013-10-24 DIAGNOSIS — IMO0001 Reserved for inherently not codable concepts without codable children: Secondary | ICD-10-CM | POA: Diagnosis not present

## 2013-10-27 ENCOUNTER — Ambulatory Visit: Payer: No Typology Code available for payment source | Admitting: Rehabilitation

## 2013-10-27 DIAGNOSIS — IMO0001 Reserved for inherently not codable concepts without codable children: Secondary | ICD-10-CM | POA: Diagnosis not present

## 2013-10-29 ENCOUNTER — Ambulatory Visit: Payer: No Typology Code available for payment source | Admitting: Physical Therapy

## 2013-11-03 ENCOUNTER — Ambulatory Visit: Payer: No Typology Code available for payment source | Admitting: Rehabilitation

## 2013-11-03 DIAGNOSIS — IMO0001 Reserved for inherently not codable concepts without codable children: Secondary | ICD-10-CM | POA: Diagnosis not present

## 2013-11-05 ENCOUNTER — Ambulatory Visit: Payer: No Typology Code available for payment source | Admitting: Rehabilitation

## 2013-11-05 DIAGNOSIS — IMO0001 Reserved for inherently not codable concepts without codable children: Secondary | ICD-10-CM | POA: Diagnosis not present

## 2013-11-12 ENCOUNTER — Ambulatory Visit: Payer: Medicaid Other | Attending: Orthopedic Surgery | Admitting: Physical Therapy

## 2013-11-12 DIAGNOSIS — M25579 Pain in unspecified ankle and joints of unspecified foot: Secondary | ICD-10-CM | POA: Insufficient documentation

## 2013-11-12 DIAGNOSIS — R262 Difficulty in walking, not elsewhere classified: Secondary | ICD-10-CM | POA: Diagnosis not present

## 2013-11-12 DIAGNOSIS — M25673 Stiffness of unspecified ankle, not elsewhere classified: Secondary | ICD-10-CM | POA: Insufficient documentation

## 2013-11-12 DIAGNOSIS — IMO0001 Reserved for inherently not codable concepts without codable children: Secondary | ICD-10-CM | POA: Insufficient documentation

## 2013-11-12 DIAGNOSIS — M25676 Stiffness of unspecified foot, not elsewhere classified: Secondary | ICD-10-CM | POA: Insufficient documentation

## 2013-11-12 DIAGNOSIS — R609 Edema, unspecified: Secondary | ICD-10-CM | POA: Diagnosis not present

## 2013-11-14 DIAGNOSIS — N61 Mastitis without abscess: Secondary | ICD-10-CM | POA: Insufficient documentation

## 2013-11-17 ENCOUNTER — Ambulatory Visit: Payer: Medicaid Other | Admitting: Rehabilitation

## 2013-11-17 DIAGNOSIS — IMO0001 Reserved for inherently not codable concepts without codable children: Secondary | ICD-10-CM | POA: Diagnosis not present

## 2013-11-20 ENCOUNTER — Ambulatory Visit: Payer: Medicaid Other | Admitting: Rehabilitation

## 2013-11-20 DIAGNOSIS — IMO0001 Reserved for inherently not codable concepts without codable children: Secondary | ICD-10-CM | POA: Diagnosis not present

## 2013-11-24 ENCOUNTER — Ambulatory Visit: Payer: Medicaid Other | Admitting: Rehabilitation

## 2013-11-24 DIAGNOSIS — IMO0001 Reserved for inherently not codable concepts without codable children: Secondary | ICD-10-CM | POA: Diagnosis not present

## 2013-11-27 ENCOUNTER — Ambulatory Visit: Payer: Medicaid Other | Admitting: Physical Therapy

## 2013-11-27 DIAGNOSIS — IMO0001 Reserved for inherently not codable concepts without codable children: Secondary | ICD-10-CM | POA: Diagnosis not present

## 2013-12-01 ENCOUNTER — Ambulatory Visit: Payer: Medicaid Other | Admitting: Rehabilitation

## 2013-12-01 DIAGNOSIS — IMO0001 Reserved for inherently not codable concepts without codable children: Secondary | ICD-10-CM | POA: Diagnosis not present

## 2013-12-04 ENCOUNTER — Ambulatory Visit: Payer: Medicaid Other | Admitting: Physical Therapy

## 2013-12-04 DIAGNOSIS — IMO0001 Reserved for inherently not codable concepts without codable children: Secondary | ICD-10-CM | POA: Diagnosis not present

## 2013-12-08 ENCOUNTER — Ambulatory Visit: Payer: Medicaid Other | Admitting: Rehabilitation

## 2013-12-08 DIAGNOSIS — IMO0001 Reserved for inherently not codable concepts without codable children: Secondary | ICD-10-CM | POA: Diagnosis not present

## 2013-12-11 ENCOUNTER — Ambulatory Visit: Payer: Medicaid Other | Admitting: Physical Therapy

## 2013-12-11 DIAGNOSIS — IMO0001 Reserved for inherently not codable concepts without codable children: Secondary | ICD-10-CM | POA: Diagnosis not present

## 2013-12-12 ENCOUNTER — Encounter (HOSPITAL_BASED_OUTPATIENT_CLINIC_OR_DEPARTMENT_OTHER): Payer: Self-pay | Admitting: *Deleted

## 2013-12-13 DIAGNOSIS — Z969 Presence of functional implant, unspecified: Secondary | ICD-10-CM

## 2013-12-13 HISTORY — DX: Presence of functional implant, unspecified: Z96.9

## 2013-12-15 ENCOUNTER — Encounter (HOSPITAL_BASED_OUTPATIENT_CLINIC_OR_DEPARTMENT_OTHER): Payer: Self-pay | Admitting: *Deleted

## 2013-12-15 ENCOUNTER — Ambulatory Visit: Payer: Medicaid Other | Admitting: Rehabilitation

## 2013-12-18 ENCOUNTER — Ambulatory Visit: Payer: Medicaid Other | Attending: Orthopedic Surgery | Admitting: Rehabilitation

## 2013-12-18 DIAGNOSIS — R609 Edema, unspecified: Secondary | ICD-10-CM | POA: Insufficient documentation

## 2013-12-18 DIAGNOSIS — R262 Difficulty in walking, not elsewhere classified: Secondary | ICD-10-CM | POA: Insufficient documentation

## 2013-12-18 DIAGNOSIS — M25673 Stiffness of unspecified ankle, not elsewhere classified: Secondary | ICD-10-CM | POA: Diagnosis not present

## 2013-12-18 DIAGNOSIS — M25579 Pain in unspecified ankle and joints of unspecified foot: Secondary | ICD-10-CM | POA: Insufficient documentation

## 2013-12-18 DIAGNOSIS — M25676 Stiffness of unspecified foot, not elsewhere classified: Secondary | ICD-10-CM | POA: Insufficient documentation

## 2013-12-18 DIAGNOSIS — IMO0001 Reserved for inherently not codable concepts without codable children: Secondary | ICD-10-CM | POA: Diagnosis not present

## 2013-12-19 ENCOUNTER — Encounter (HOSPITAL_BASED_OUTPATIENT_CLINIC_OR_DEPARTMENT_OTHER): Payer: Medicaid Other | Admitting: Anesthesiology

## 2013-12-19 ENCOUNTER — Ambulatory Visit (HOSPITAL_BASED_OUTPATIENT_CLINIC_OR_DEPARTMENT_OTHER)
Admission: RE | Admit: 2013-12-19 | Discharge: 2013-12-19 | Disposition: A | Discharge status: Home / Self Care | Payer: Medicaid Other | Source: Ambulatory Visit | Attending: Orthopedic Surgery | Admitting: Orthopedic Surgery

## 2013-12-19 ENCOUNTER — Encounter (HOSPITAL_BASED_OUTPATIENT_CLINIC_OR_DEPARTMENT_OTHER)
Admission: RE | Disposition: A | Discharge status: Home / Self Care | Payer: Self-pay | Source: Ambulatory Visit | Attending: Orthopedic Surgery

## 2013-12-19 ENCOUNTER — Encounter (HOSPITAL_BASED_OUTPATIENT_CLINIC_OR_DEPARTMENT_OTHER): Payer: Self-pay | Admitting: *Deleted

## 2013-12-19 ENCOUNTER — Ambulatory Visit (HOSPITAL_BASED_OUTPATIENT_CLINIC_OR_DEPARTMENT_OTHER): Payer: Medicaid Other | Admitting: Anesthesiology

## 2013-12-19 DIAGNOSIS — T84498A Other mechanical complication of other internal orthopedic devices, implants and grafts, initial encounter: Secondary | ICD-10-CM | POA: Diagnosis not present

## 2013-12-19 DIAGNOSIS — Y9210 Unspecified residential institution as the place of occurrence of the external cause: Secondary | ICD-10-CM | POA: Diagnosis not present

## 2013-12-19 DIAGNOSIS — Y831 Surgical operation with implant of artificial internal device as the cause of abnormal reaction of the patient, or of later complication, without mention of misadventure at the time of the procedure: Secondary | ICD-10-CM | POA: Diagnosis not present

## 2013-12-19 HISTORY — DX: Presence of functional implant, unspecified: Z96.9

## 2013-12-19 HISTORY — PX: HARDWARE REMOVAL: SHX979

## 2013-12-19 HISTORY — DX: Dental restoration status: Z98.811

## 2013-12-19 LAB — POCT HEMOGLOBIN-HEMACUE: Hemoglobin: 14.4 g/dL (ref 12.0–15.0)

## 2013-12-19 SURGERY — REMOVAL, HARDWARE
Anesthesia: General | Site: Foot | Laterality: Right

## 2013-12-19 MED ORDER — FENTANYL CITRATE 0.05 MG/ML IJ SOLN: 50.0000 ug | INTRAMUSCULAR | Status: DC | PRN

## 2013-12-19 MED ORDER — MIDAZOLAM HCL 2 MG/2ML IJ SOLN: 1.0000 mg | INTRAMUSCULAR | Status: DC | PRN

## 2013-12-19 MED ORDER — OXYCODONE HCL 5 MG PO TABS
ORAL_TABLET | ORAL | Status: AC
  Filled 2013-12-19: qty 1

## 2013-12-19 MED ORDER — DEXAMETHASONE SODIUM PHOSPHATE 10 MG/ML IJ SOLN
INTRAMUSCULAR | Status: DC | PRN
  Administered 2013-12-19: 10 mg via INTRAVENOUS

## 2013-12-19 MED ORDER — HYDROMORPHONE HCL PF 1 MG/ML IJ SOLN
INTRAMUSCULAR | Status: AC
  Filled 2013-12-19: qty 1

## 2013-12-19 MED ORDER — ACETAMINOPHEN 500 MG PO TABS
1000.0000 mg | ORAL_TABLET | Freq: Once | ORAL | Status: AC
  Administered 2013-12-19: 1000 mg via ORAL

## 2013-12-19 MED ORDER — MEPERIDINE HCL 25 MG/ML IJ SOLN: 6.2500 mg | INTRAMUSCULAR | Status: DC | PRN

## 2013-12-19 MED ORDER — ACETAMINOPHEN 500 MG PO TABS
ORAL_TABLET | ORAL | Status: AC
  Filled 2013-12-19: qty 2

## 2013-12-19 MED ORDER — MIDAZOLAM HCL 5 MG/5ML IJ SOLN
INTRAMUSCULAR | Status: DC | PRN
  Administered 2013-12-19: 1 mg via INTRAVENOUS

## 2013-12-19 MED ORDER — HYDROMORPHONE HCL PF 1 MG/ML IJ SOLN
0.2500 mg | INTRAMUSCULAR | Status: DC | PRN
  Administered 2013-12-19 (×3): 0.5 mg via INTRAVENOUS

## 2013-12-19 MED ORDER — ONDANSETRON HCL 4 MG/2ML IJ SOLN
INTRAMUSCULAR | Status: DC | PRN
  Administered 2013-12-19: 4 mg via INTRAVENOUS

## 2013-12-19 MED ORDER — LACTATED RINGERS IV SOLN
INTRAVENOUS | Status: DC
  Administered 2013-12-19: 14:00:00 via INTRAVENOUS

## 2013-12-19 MED ORDER — MIDAZOLAM HCL 2 MG/ML PO SYRP: 12.0000 mg | ORAL_SOLUTION | Freq: Once | ORAL | Status: DC | PRN

## 2013-12-19 MED ORDER — PROPOFOL 10 MG/ML IV BOLUS
INTRAVENOUS | Status: DC | PRN
  Administered 2013-12-19: 150 mg via INTRAVENOUS

## 2013-12-19 MED ORDER — FENTANYL CITRATE 0.05 MG/ML IJ SOLN
INTRAMUSCULAR | Status: AC
  Filled 2013-12-19: qty 6

## 2013-12-19 MED ORDER — DEXTROSE-NACL 5-0.45 % IV SOLN: 100.0000 mL/h | INTRAVENOUS | Status: DC

## 2013-12-19 MED ORDER — ONDANSETRON HCL 4 MG/2ML IJ SOLN: 4.0000 mg | Freq: Once | INTRAMUSCULAR | Status: DC | PRN

## 2013-12-19 MED ORDER — CEFAZOLIN SODIUM 10 G IJ SOLR
3.0000 g | INTRAMUSCULAR | Status: AC
  Administered 2013-12-19: 3 g via INTRAVENOUS

## 2013-12-19 MED ORDER — OXYCODONE HCL 5 MG/5ML PO SOLN: 5.0000 mg | Freq: Once | ORAL | Status: AC | PRN

## 2013-12-19 MED ORDER — CEFAZOLIN SODIUM-DEXTROSE 2-3 GM-% IV SOLR
INTRAVENOUS | Status: AC
  Filled 2013-12-19: qty 50

## 2013-12-19 MED ORDER — HYDROCODONE-ACETAMINOPHEN 5-325 MG PO TABS: 1.0000 | ORAL_TABLET | ORAL | Status: AC | PRN

## 2013-12-19 MED ORDER — CEFAZOLIN SODIUM 1-5 GM-% IV SOLN
INTRAVENOUS | Status: AC
  Filled 2013-12-19: qty 50

## 2013-12-19 MED ORDER — LIDOCAINE HCL (CARDIAC) 20 MG/ML IV SOLN
INTRAVENOUS | Status: DC | PRN
  Administered 2013-12-19: 60 mg via INTRAVENOUS

## 2013-12-19 MED ORDER — ONDANSETRON HCL 4 MG PO TABS: 4.0000 mg | ORAL_TABLET | Freq: Three times a day (TID) | ORAL | Status: AC | PRN

## 2013-12-19 MED ORDER — MIDAZOLAM HCL 2 MG/2ML IJ SOLN
INTRAMUSCULAR | Status: AC
  Filled 2013-12-19: qty 2

## 2013-12-19 MED ORDER — OXYCODONE HCL 5 MG PO TABS
5.0000 mg | ORAL_TABLET | Freq: Once | ORAL | Status: AC | PRN
  Administered 2013-12-19: 5 mg via ORAL

## 2013-12-19 MED ORDER — FENTANYL CITRATE 0.05 MG/ML IJ SOLN
INTRAMUSCULAR | Status: DC | PRN
  Administered 2013-12-19: 100 ug via INTRAVENOUS

## 2013-12-19 SURGICAL SUPPLY — 65 items
APL SKNCLS STERI-STRIP NONHPOA (GAUZE/BANDAGES/DRESSINGS)
BANDAGE ELASTIC 4 VELCRO ST LF (GAUZE/BANDAGES/DRESSINGS) ×3 IMPLANT
BENZOIN TINCTURE PRP APPL 2/3 (GAUZE/BANDAGES/DRESSINGS) ×1 IMPLANT
BLADE SURG 15 STRL LF DISP TIS (BLADE) ×1 IMPLANT
BLADE SURG 15 STRL SS (BLADE) ×3
BNDG CMPR 9X4 STRL LF SNTH (GAUZE/BANDAGES/DRESSINGS) ×1
BNDG COHESIVE 4X5 TAN STRL (GAUZE/BANDAGES/DRESSINGS) ×3 IMPLANT
BNDG ESMARK 4X9 LF (GAUZE/BANDAGES/DRESSINGS) ×3 IMPLANT
CHLORAPREP W/TINT 26ML (MISCELLANEOUS) ×3 IMPLANT
CLOSURE WOUND 1/2 X4 (GAUZE/BANDAGES/DRESSINGS)
COVER TABLE BACK 60X90 (DRAPES) ×3 IMPLANT
CUFF TOURNIQUET SINGLE 24IN (TOURNIQUET CUFF) IMPLANT
CUFF TOURNIQUET SINGLE 34IN LL (TOURNIQUET CUFF) ×6 IMPLANT
DECANTER SPIKE VIAL GLASS SM (MISCELLANEOUS) IMPLANT
DRAPE EXTREMITY T 121X128X90 (DRAPE) ×3 IMPLANT
DRAPE OEC MINIVIEW 54X84 (DRAPES) ×3 IMPLANT
DRAPE SURG 17X23 STRL (DRAPES) IMPLANT
DRAPE U 20/CS (DRAPES) ×3 IMPLANT
DRAPE U-SHAPE 47X51 STRL (DRAPES) ×2 IMPLANT
DRSG EMULSION OIL 3X3 NADH (GAUZE/BANDAGES/DRESSINGS) ×3 IMPLANT
ELECT REM PT RETURN 9FT ADLT (ELECTROSURGICAL) ×3
ELECTRODE REM PT RTRN 9FT ADLT (ELECTROSURGICAL) ×1 IMPLANT
GAUZE SPONGE 4X4 12PLY STRL (GAUZE/BANDAGES/DRESSINGS) ×3 IMPLANT
GAUZE XEROFORM 1X8 LF (GAUZE/BANDAGES/DRESSINGS) ×2 IMPLANT
GLOVE BIO SURGEON STRL SZ 6.5 (GLOVE) ×1 IMPLANT
GLOVE BIO SURGEON STRL SZ7.5 (GLOVE) ×3 IMPLANT
GLOVE BIO SURGEONS STRL SZ 6.5 (GLOVE) ×1
GLOVE BIOGEL M 7.0 STRL (GLOVE) ×3 IMPLANT
GLOVE BIOGEL PI IND STRL 7.5 (GLOVE) IMPLANT
GLOVE BIOGEL PI IND STRL 8 (GLOVE) ×1 IMPLANT
GLOVE BIOGEL PI INDICATOR 7.5 (GLOVE) ×2
GLOVE BIOGEL PI INDICATOR 8 (GLOVE) ×2
GOWN STRL REUS W/ TWL LRG LVL3 (GOWN DISPOSABLE) ×4 IMPLANT
GOWN STRL REUS W/ TWL XL LVL3 (GOWN DISPOSABLE) ×1 IMPLANT
GOWN STRL REUS W/TWL LRG LVL3 (GOWN DISPOSABLE) ×12
GOWN STRL REUS W/TWL XL LVL3 (GOWN DISPOSABLE) ×3
NEEDLE HYPO 25X1 1.5 SAFETY (NEEDLE) ×3 IMPLANT
NS IRRIG 1000ML POUR BTL (IV SOLUTION) ×3 IMPLANT
PACK BASIN DAY SURGERY FS (CUSTOM PROCEDURE TRAY) ×3 IMPLANT
PAD CAST 4YDX4 CTTN HI CHSV (CAST SUPPLIES) ×1 IMPLANT
PADDING CAST ABS 4INX4YD NS (CAST SUPPLIES) ×2
PADDING CAST ABS COTTON 4X4 ST (CAST SUPPLIES) ×1 IMPLANT
PADDING CAST COTTON 4X4 STRL (CAST SUPPLIES) ×3
PENCIL BUTTON HOLSTER BLD 10FT (ELECTRODE) ×3 IMPLANT
SHEET MEDIUM DRAPE 40X70 STRL (DRAPES) IMPLANT
SLEEVE SCD COMPRESS KNEE MED (MISCELLANEOUS) IMPLANT
SPONGE LAP 4X18 X RAY DECT (DISPOSABLE) ×3 IMPLANT
STOCKINETTE 4X48 STRL (DRAPES) IMPLANT
STOCKINETTE 6  STRL (DRAPES) ×2
STOCKINETTE 6 STRL (DRAPES) ×1 IMPLANT
STRIP CLOSURE SKIN 1/2X4 (GAUZE/BANDAGES/DRESSINGS) IMPLANT
SUCTION FRAZIER TIP 10 FR DISP (SUCTIONS) IMPLANT
SUT ETHILON 3 0 PS 1 (SUTURE) ×2 IMPLANT
SUT MON AB 4-0 PC3 18 (SUTURE) IMPLANT
SUT PROLENE 3 0 PS 2 (SUTURE) IMPLANT
SUT VIC AB 2-0 SH 27 (SUTURE)
SUT VIC AB 2-0 SH 27XBRD (SUTURE) IMPLANT
SUT VIC AB 3-0 FS2 27 (SUTURE) IMPLANT
SYR BULB 3OZ (MISCELLANEOUS) ×3 IMPLANT
SYR CONTROL 10ML LL (SYRINGE) IMPLANT
TOWEL OR 17X24 6PK STRL BLUE (TOWEL DISPOSABLE) ×3 IMPLANT
TOWEL OR NON WOVEN STRL DISP B (DISPOSABLE) ×3 IMPLANT
TUBE CONNECTING 20'X1/4 (TUBING)
TUBE CONNECTING 20X1/4 (TUBING) IMPLANT
UNDERPAD 30X30 INCONTINENT (UNDERPADS AND DIAPERS) ×3 IMPLANT

## 2013-12-19 NOTE — Transfer of Care (Signed)
Immediate Anesthesia Transfer of Care Note  Patient: Joanne Soto  Procedure(s) Performed: Procedure(s): HARDWARE REMOVAL RIGHT FOOT  (Right)  Patient Location: PACU  Anesthesia Type:General  Level of Consciousness: awake, alert  and oriented  Airway & Oxygen Therapy: Patient Spontanous Breathing and Patient connected to face mask oxygen  Post-op Assessment: Report given to PACU RN, Post -op Vital signs reviewed and stable and Patient moving all extremities  Post vital signs: Reviewed and stable  Complications: No apparent anesthesia complications
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
## 2013-12-19 NOTE — Discharge Instructions (Signed)
Use your arch supports  Crutches as needed for comfort  Remove dressings on Monday then may shower but no soaking the wound, cover with a  band aid after showers.   Discharge Instructions After Orthopedic Procedures:  *You may feel tired and weak following your procedure. It is recommended that you limit physical activity for the next 24 hours and rest at home for the remainder of today and tomorrow. *No strenuous activity should be started without your doctor's permission.  Elevate the extremity that you had surgery on to a level above your heart. This should continue for 48 hours or as instructed by your doctor.  If you had hand, arm or shoulder surgery you should move your fingers frequently unless otherwise instructed by your doctor.  If you had foot, knee or leg surgery you should wiggle your toes frequently unless otherwise instructed by your doctor.  Follow your doctor's exact instructions for activity at home. Use your home equipment as instructed. (Crutches, hard shoes, slings etc.)  Limit your activity as instructed by your doctor.  Report to your doctor should any of the following occur: 1. Extreme swelling of your fingers or toes. 2. Inability to wiggle your fingers or toes. 3. Coldness, pale or bluish color in your fingers or toes. 4. Loss of sensation, numbness or tingling of your fingers or toes. 5. Unusual smell or odor from under your dressing or cast. 6. Excessive bleeding or drainage from the surgical site. 7. Pain not relieved by medication your doctor has prescribed for you. 8. Cast or dressing too tight (do not get your dressing or cast wet or put anything under          your dressing or cast.)  *Do not change your dressing unless instructed by your doctor or discharge nurse. Then follow exact instructions.  *Follow labeled instructions for any medications that your doctor may have prescribed for you. *Should any questions or complications develop following  your procedure, PLEASE CONTACT YOUR DOCTOR.  Post Anesthesia Home Care Instructions  Activity: Get plenty of rest for the remainder of the day. A responsible adult should stay with you for 24 hours following the procedure.  For the next 24 hours, DO NOT: -Drive a car -Advertising copywriterperate machinery -Drink alcoholic beverages -Take any medication unless instructed by your physician -Make any legal decisions or sign important papers.  Meals: Start with liquid foods such as gelatin or soup. Progress to regular foods as tolerated. Avoid greasy, spicy, heavy foods. If nausea and/or vomiting occur, drink only clear liquids until the nausea and/or vomiting subsides. Call your physician if vomiting continues.  Special Instructions/Symptoms: Your throat may feel dry or sore from the anesthesia or the breathing tube placed in your throat during surgery. If this causes discomfort, gargle with warm salt water. The discomfort should disappear within 24 hours.

## 2013-12-19 NOTE — Anesthesia Postprocedure Evaluation (Signed)
Anesthesia Post Note  Patient: Joanne Soto  Procedure(s) Performed: Procedure(s) (LRB): HARDWARE REMOVAL RIGHT FOOT  (Right)  Anesthesia type: general  Patient location: PACU  Post pain: Pain level controlled  Post assessment: Patient's Cardiovascular Status Stable  Last Vitals:  Filed Vitals:   12/19/13 1536  BP: 140/87  Pulse: 78  Temp: 36.6 C  Resp: 16    Post vital signs: Reviewed and stable  Level of consciousness: sedated  Complications: No apparent anesthesia complications

## 2013-12-19 NOTE — Anesthesia Preprocedure Evaluation (Signed)
Anesthesia Evaluation  Patient identified by MRN, date of birth, ID band Patient awake    Reviewed: Allergy & Precautions, H&P , NPO status , Patient's Chart, lab work & pertinent test results  Airway Mallampati: I TM Distance: >3 FB Neck ROM: Full    Dental   Pulmonary Current Smoker,          Cardiovascular     Neuro/Psych    GI/Hepatic   Endo/Other    Renal/GU      Musculoskeletal   Abdominal   Peds  Hematology   Anesthesia Other Findings   Reproductive/Obstetrics                           Anesthesia Physical Anesthesia Plan  ASA: II  Anesthesia Plan: General   Post-op Pain Management:    Induction: Intravenous  Airway Management Planned: LMA  Additional Equipment:   Intra-op Plan:   Post-operative Plan: Extubation in OR  Informed Consent: I have reviewed the patients History and Physical, chart, labs and discussed the procedure including the risks, benefits and alternatives for the proposed anesthesia with the patient or authorized representative who has indicated his/her understanding and acceptance.     Plan Discussed with: CRNA and Surgeon  Anesthesia Plan Comments:         Anesthesia Quick Evaluation  

## 2013-12-19 NOTE — Interval H&P Note (Signed)
History and Physical Interval Note:  12/19/2013 8:47 AM  Joanne Soto  has presented today for surgery, with the diagnosis of RIGHT FOOT MECHANICAL COMPLICATIONS     The various methods of treatment have been discussed with the patient and family. After consideration of risks, benefits and other options for treatment, the patient has consented to  Procedure(s): HARDWARE REMOVAL RIGHT FOOT  (Right) as a surgical intervention .  The patient's history has been reviewed, patient examined, no change in status, stable for surgery.  I have reviewed the patient's chart and labs.  Questions were answered to the patient's satisfaction.     MURPHY, TIMOTHY, D

## 2013-12-19 NOTE — Op Note (Signed)
12/19/2013  3:28 PM  PATIENT:  Joanne Soto    PRE-OPERATIVE DIAGNOSIS:  RIGHT FOOT MECHANICAL COMPLICATIONS     POST-OPERATIVE DIAGNOSIS:  Same  PROCEDURE:  HARDWARE REMOVAL RIGHT FOOT   SURGEON:  MURPHY, TIMOTHY, D, MD  ASSISTANT: none  ANESTHESIA:   gen  PREOPERATIVE INDICATIONS:  Joanne Mulllizabeth J Plaskett is a  33 y.o. female with a diagnosis of RIGHT FOOT MECHANICAL COMPLICATIONS    who failed conservative measures and elected for surgical management.    The risks benefits and alternatives were discussed with the patient preoperatively including but not limited to the risks of infection, bleeding, nerve injury, cardiopulmonary complications, the need for revision surgery, among others, and the patient was willing to proceed.  OPERATIVE IMPLANTS: none  OPERATIVE FINDINGS: removal of screw  BLOOD LOSS: min  COMPLICATIONS: none  TOURNIQUET TIME: none  OPERATIVE PROCEDURE:  Patient was identified in the preoperative holding area and site was marked by me She was transported to the operating theater and placed on the table in supine position taking care to pad all bony prominences. After a preincinduction time out anesthesia was induced. The right lower extremity was prepped and draped in normal sterile fashion and a pre-incision timeout was performed. She received ancef for preoperative antibiotics.   I made a 3 mm incision directly over top of the palpable screw head I confirm that this was a screw head with one x-ray shaft. I spread down with a hemostat I was unable to get the small frag screwdriver and the screw and it came out intact. I then took x-rays and her Lisfranc joint was lined up well in all hardware was removed.  I thoroughly irrigated her wound closed with a simple stitch placed a sterile dressing and an Ace wrap. She was taken the PACU in stable condition.  POST OPERATIVE PLAN: WBAT. Use arch support

## 2013-12-19 NOTE — Anesthesia Procedure Notes (Signed)
Procedure Name: LMA Insertion Date/Time: 12/19/2013 3:05 PM Performed by: Curly ShoresRAFT, Choice Kleinsasser W Pre-anesthesia Checklist: Patient identified, Emergency Drugs available, Suction available and Patient being monitored Patient Re-evaluated:Patient Re-evaluated prior to inductionOxygen Delivery Method: Circle System Utilized Preoxygenation: Pre-oxygenation with 100% oxygen Intubation Type: IV induction Ventilation: Mask ventilation without difficulty LMA: LMA inserted LMA Size: 4.0 Number of attempts: 1 Airway Equipment and Method: bite block Placement Confirmation: positive ETCO2 and breath sounds checked- equal and bilateral Tube secured with: Tape Dental Injury: Teeth and Oropharynx as per pre-operative assessment

## 2013-12-22 ENCOUNTER — Encounter (HOSPITAL_BASED_OUTPATIENT_CLINIC_OR_DEPARTMENT_OTHER): Payer: Self-pay | Admitting: Orthopedic Surgery

## 2013-12-22 ENCOUNTER — Ambulatory Visit: Payer: Medicaid Other | Admitting: Physical Therapy

## 2013-12-22 DIAGNOSIS — IMO0001 Reserved for inherently not codable concepts without codable children: Secondary | ICD-10-CM | POA: Diagnosis not present

## 2013-12-25 ENCOUNTER — Ambulatory Visit: Payer: Medicaid Other | Admitting: Rehabilitation

## 2013-12-29 ENCOUNTER — Ambulatory Visit: Payer: Medicaid Other | Admitting: Physical Therapy

## 2013-12-29 DIAGNOSIS — IMO0001 Reserved for inherently not codable concepts without codable children: Secondary | ICD-10-CM | POA: Diagnosis not present

## 2014-01-01 ENCOUNTER — Ambulatory Visit: Payer: Medicaid Other | Admitting: Physical Therapy

## 2014-01-01 DIAGNOSIS — IMO0001 Reserved for inherently not codable concepts without codable children: Secondary | ICD-10-CM | POA: Diagnosis not present

## 2014-01-05 ENCOUNTER — Ambulatory Visit: Payer: Medicaid Other | Admitting: Rehabilitation

## 2014-01-05 DIAGNOSIS — IMO0001 Reserved for inherently not codable concepts without codable children: Secondary | ICD-10-CM | POA: Diagnosis not present

## 2014-01-08 ENCOUNTER — Ambulatory Visit: Payer: Medicaid Other | Admitting: Rehabilitation

## 2014-01-08 DIAGNOSIS — IMO0001 Reserved for inherently not codable concepts without codable children: Secondary | ICD-10-CM | POA: Diagnosis not present

## 2014-02-04 ENCOUNTER — Ambulatory Visit: Payer: Medicaid Other | Attending: Orthopedic Surgery | Admitting: Physical Therapy

## 2014-02-04 DIAGNOSIS — R609 Edema, unspecified: Secondary | ICD-10-CM | POA: Insufficient documentation

## 2014-02-04 DIAGNOSIS — M25579 Pain in unspecified ankle and joints of unspecified foot: Secondary | ICD-10-CM | POA: Diagnosis not present

## 2014-02-04 DIAGNOSIS — R262 Difficulty in walking, not elsewhere classified: Secondary | ICD-10-CM | POA: Insufficient documentation

## 2014-02-04 DIAGNOSIS — IMO0001 Reserved for inherently not codable concepts without codable children: Secondary | ICD-10-CM | POA: Insufficient documentation

## 2014-02-04 DIAGNOSIS — M25676 Stiffness of unspecified foot, not elsewhere classified: Secondary | ICD-10-CM | POA: Insufficient documentation

## 2014-02-04 DIAGNOSIS — M25673 Stiffness of unspecified ankle, not elsewhere classified: Secondary | ICD-10-CM | POA: Diagnosis not present

## 2014-02-11 ENCOUNTER — Ambulatory Visit: Payer: Medicaid Other | Admitting: Physical Therapy

## 2014-02-11 DIAGNOSIS — IMO0001 Reserved for inherently not codable concepts without codable children: Secondary | ICD-10-CM | POA: Diagnosis not present

## 2014-02-20 ENCOUNTER — Ambulatory Visit: Payer: Medicaid Other | Admitting: Rehabilitation

## 2014-02-23 ENCOUNTER — Ambulatory Visit: Payer: Medicaid Other | Admitting: Physical Therapy

## 2014-02-24 ENCOUNTER — Ambulatory Visit: Payer: Medicaid Other | Attending: Orthopedic Surgery | Admitting: Rehabilitation

## 2014-02-24 DIAGNOSIS — M25671 Stiffness of right ankle, not elsewhere classified: Secondary | ICD-10-CM | POA: Diagnosis not present

## 2014-02-24 DIAGNOSIS — M25571 Pain in right ankle and joints of right foot: Secondary | ICD-10-CM | POA: Diagnosis not present

## 2014-02-24 DIAGNOSIS — R262 Difficulty in walking, not elsewhere classified: Secondary | ICD-10-CM | POA: Insufficient documentation

## 2014-02-24 DIAGNOSIS — Z9889 Other specified postprocedural states: Secondary | ICD-10-CM | POA: Insufficient documentation

## 2014-03-04 ENCOUNTER — Ambulatory Visit: Payer: Medicaid Other | Admitting: Physical Therapy

## 2014-03-04 DIAGNOSIS — M25571 Pain in right ankle and joints of right foot: Secondary | ICD-10-CM | POA: Diagnosis not present

## 2015-05-19 IMAGING — CR DG FOOT COMPLETE 3+V*R*
3 series · 3 of 3 positions shown · non-contrast
Comparison: none

[AP]
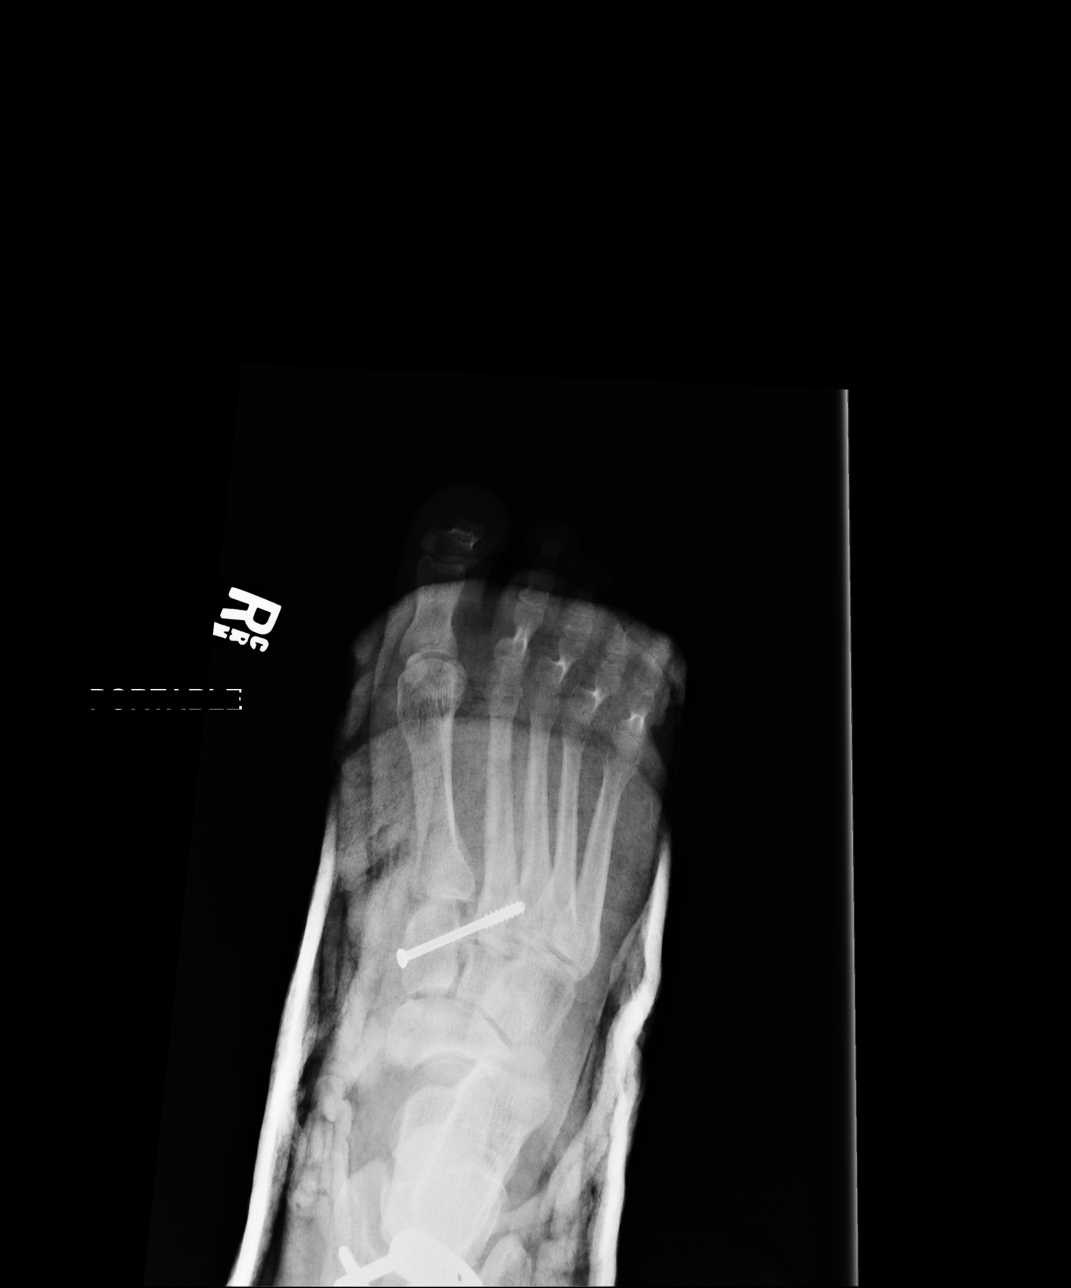

[ap obl int rot]
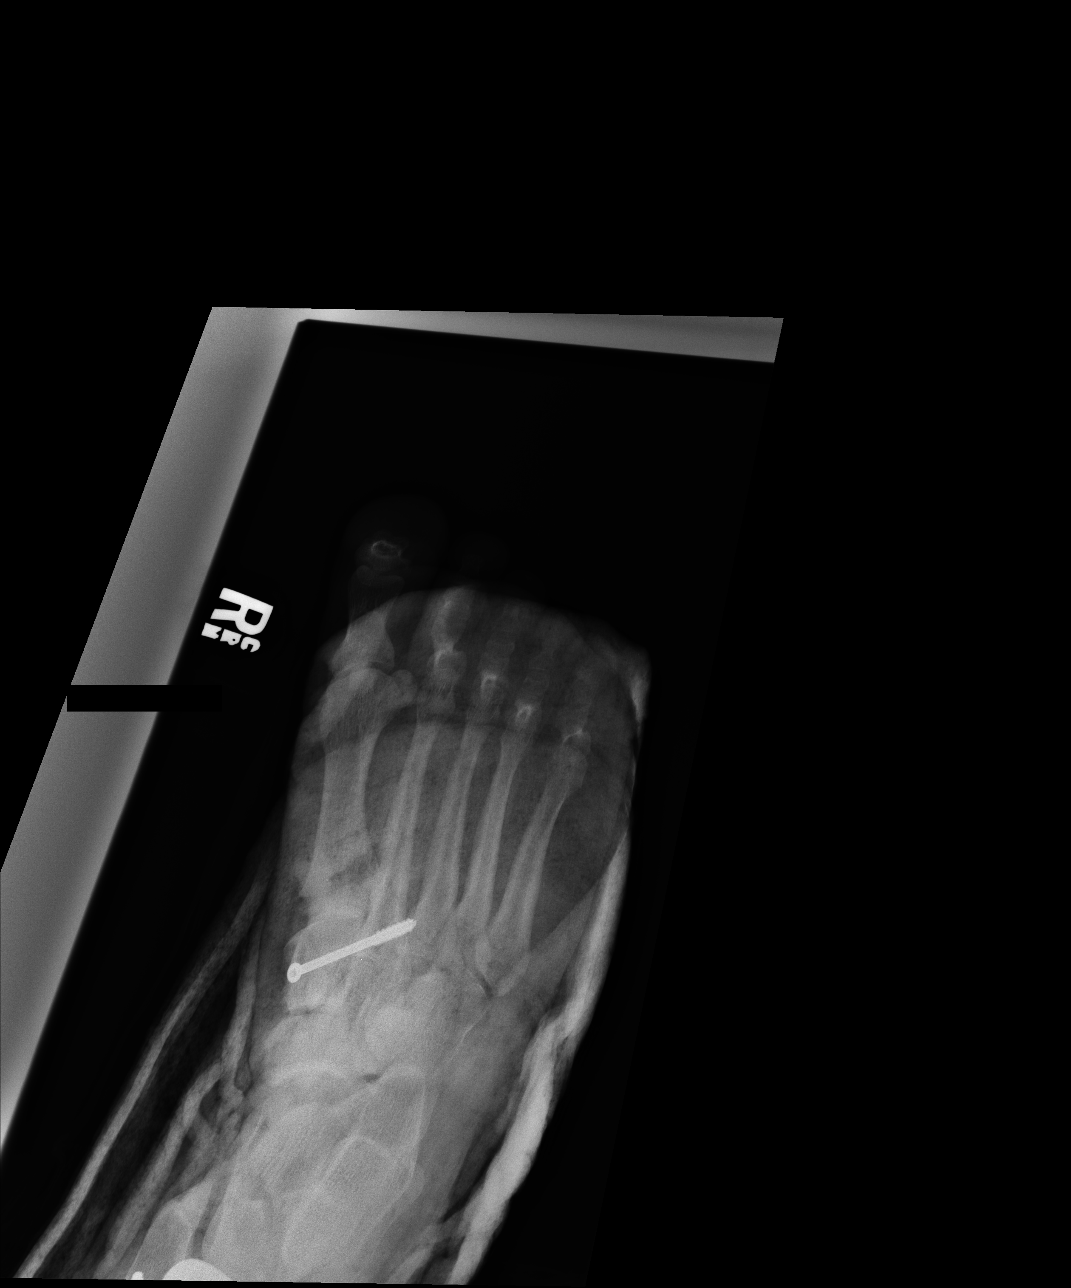

[lateral]
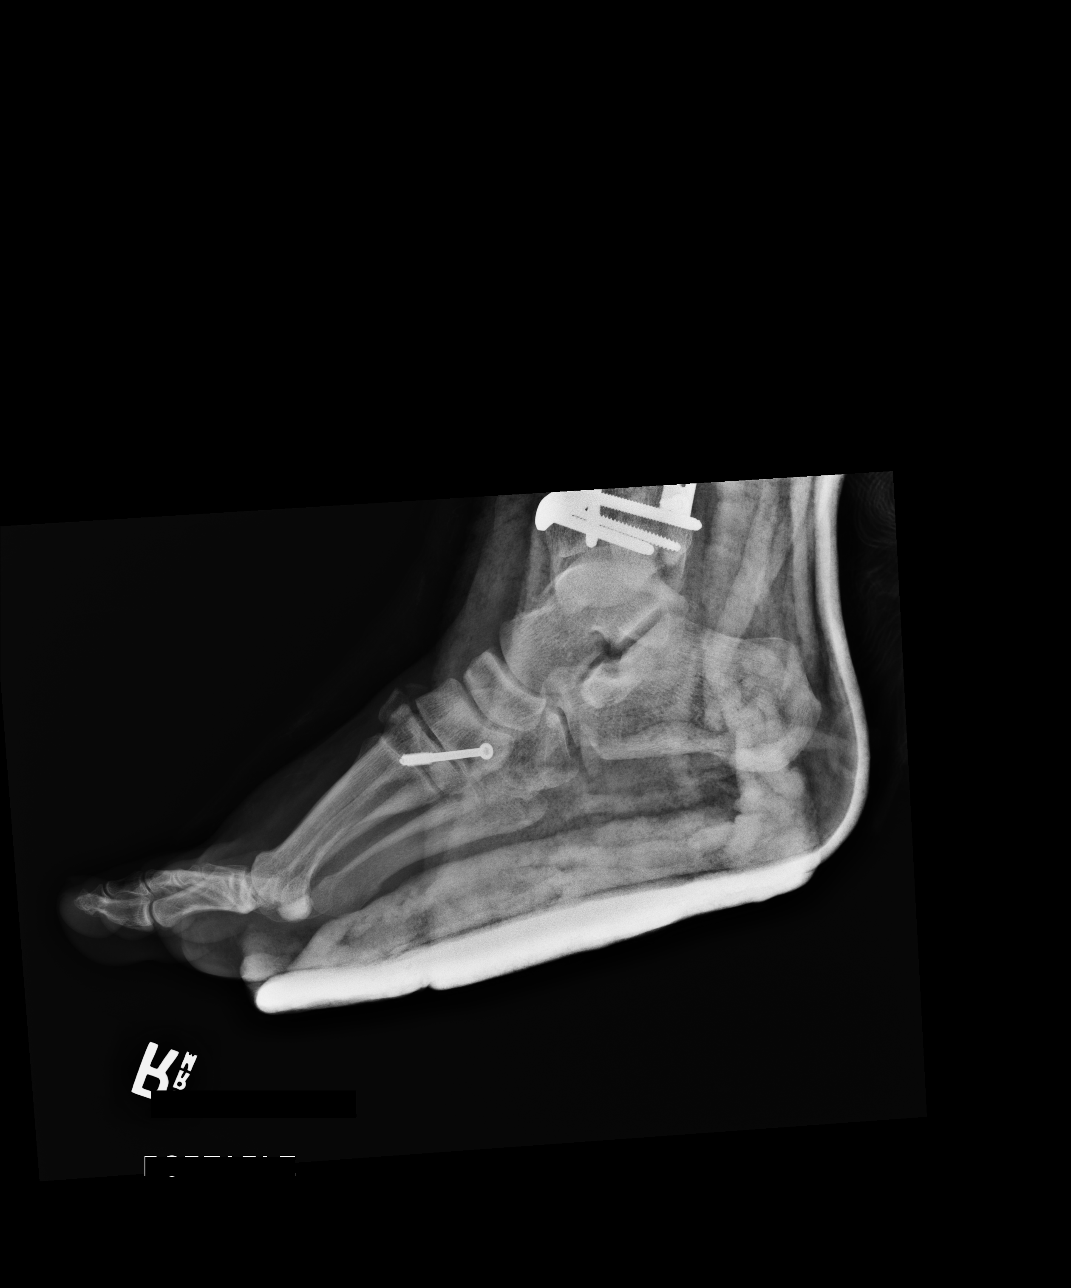

[3 of 3 positions shown; findings below may reference images not displayed]

CLINICAL DATA
Postop

EXAM
RIGHT FOOT COMPLETE - 3+ VIEW

COMPARISON
DG ANKLE COMPLETE*R* dated 07/22/2013; CT ANKLE*R* W/O CM dated
07/22/2013; DG FOOT COMPLETE*R* dated 07/22/2013; DG KNEE COMPLETE 4
VIEWS*R* dated 07/22/2013; DG ANKLE*R*PORT dated 07/23/2013

FINDINGS
Evaluation of fine bony detail is degraded secondary to overlying
casting material.

Images demonstrate the sequela of cancellous screw fixation of
previously noted displaced Lisfranc joint. The Lisfranc joint
appears persistently widened though likely improved from preceding
CT. Persistently displaced fracture fragments are noted about the
expected location of the medial cuneiform best appreciated on the
lateral radiograph and similar to preceding ankle CT.

Post ORIF of the distal tibia and fibula, incompletely imaged.

IMPRESSION
Post cancellous screw fixation of the Lisfranc joint without
evidence of complication.

SIGNATURE
